# Patient Record
Sex: Male | Born: 1965 | Race: White | Hispanic: No | Marital: Married | Smoking: Current every day smoker
Health system: Southern US, Community
[De-identification: ages and names within clinical notes are randomized; demographics above are authoritative.]

## PROBLEM LIST (undated history)

## (undated) DIAGNOSIS — E785 Hyperlipidemia, unspecified: Secondary | ICD-10-CM

## (undated) DIAGNOSIS — N529 Male erectile dysfunction, unspecified: Secondary | ICD-10-CM

## (undated) DIAGNOSIS — K2270 Barrett's esophagus without dysplasia: Secondary | ICD-10-CM

## (undated) DIAGNOSIS — M5430 Sciatica, unspecified side: Secondary | ICD-10-CM

## (undated) DIAGNOSIS — J449 Chronic obstructive pulmonary disease, unspecified: Secondary | ICD-10-CM

## (undated) DIAGNOSIS — D649 Anemia, unspecified: Secondary | ICD-10-CM

## (undated) DIAGNOSIS — K852 Alcohol induced acute pancreatitis without necrosis or infection: Secondary | ICD-10-CM

## (undated) DIAGNOSIS — Z9289 Personal history of other medical treatment: Secondary | ICD-10-CM

## (undated) DIAGNOSIS — I639 Cerebral infarction, unspecified: Secondary | ICD-10-CM

## (undated) DIAGNOSIS — K759 Inflammatory liver disease, unspecified: Secondary | ICD-10-CM

## (undated) DIAGNOSIS — I10 Essential (primary) hypertension: Secondary | ICD-10-CM

## (undated) DIAGNOSIS — K219 Gastro-esophageal reflux disease without esophagitis: Secondary | ICD-10-CM

## (undated) DIAGNOSIS — M25561 Pain in right knee: Secondary | ICD-10-CM

## (undated) HISTORY — DX: Barrett's esophagus without dysplasia: K22.70

## (undated) HISTORY — DX: Gastro-esophageal reflux disease without esophagitis: K21.9

## (undated) HISTORY — PX: HEMORRHOID BANDING: SHX5850

## (undated) HISTORY — PX: UPPER GI ENDOSCOPY: SHX6162

## (undated) HISTORY — PX: CYST EXCISION: SHX5701

## (undated) HISTORY — DX: Male erectile dysfunction, unspecified: N52.9

## (undated) HISTORY — DX: Cerebral infarction, unspecified: I63.9

## (undated) HISTORY — DX: Pain in right knee: M25.561

## (undated) HISTORY — DX: Sciatica, unspecified side: M54.30

## (undated) HISTORY — PX: COLONOSCOPY: SHX174

---

## 2004-10-26 ENCOUNTER — Inpatient Hospital Stay: Payer: Self-pay | Admitting: Internal Medicine

## 2004-12-17 ENCOUNTER — Ambulatory Visit: Payer: Self-pay | Admitting: Gastroenterology

## 2005-03-22 ENCOUNTER — Ambulatory Visit: Payer: Self-pay | Admitting: Ophthalmology

## 2008-06-30 ENCOUNTER — Emergency Department: Payer: Self-pay | Admitting: Emergency Medicine

## 2012-03-09 ENCOUNTER — Ambulatory Visit: Payer: Self-pay | Admitting: Gastroenterology

## 2012-06-22 ENCOUNTER — Ambulatory Visit: Payer: Self-pay | Admitting: Family Medicine

## 2014-02-12 ENCOUNTER — Ambulatory Visit: Payer: Self-pay | Admitting: Specialist

## 2015-02-06 ENCOUNTER — Ambulatory Visit (INDEPENDENT_AMBULATORY_CARE_PROVIDER_SITE_OTHER): Payer: Medicare Other | Admitting: Urology

## 2015-02-06 ENCOUNTER — Encounter: Payer: Self-pay | Admitting: Urology

## 2015-02-06 VITALS — BP 141/109 | HR 91 | Temp 97.5°F | Resp 16 | Ht 70.0 in | Wt 160.5 lb

## 2015-02-06 DIAGNOSIS — Z309 Encounter for contraceptive management, unspecified: Secondary | ICD-10-CM | POA: Diagnosis not present

## 2015-02-06 DIAGNOSIS — Z3009 Encounter for other general counseling and advice on contraception: Secondary | ICD-10-CM | POA: Insufficient documentation

## 2015-02-06 MED ORDER — DIAZEPAM 10 MG PO TABS: 10.0000 mg | ORAL_TABLET | Freq: Once | ORAL | Status: DC

## 2015-02-06 NOTE — Progress Notes (Addendum)
02/06/2015 9:32 AM   Ronald Watts 07/18/1965 [id number]  Referring provider: No referring provider defined for this encounter.  Chief Complaint  Patient presents with  . VAS Consult    vasectomy consult    HPI: Ronald Watts is a 49 y.o. white male who present today requesting a vasectomy.    He has two children, a son and daughter, and wishes to complete his family unit at this time.  He denies any history of chronic prostatitis, epididymitis, orchitis, or other genital pain:   Today, we discussed what the vas deferens is, where it is located, and its function. We reviewed the procedure for vasectomy, it's risks, benefits, alternatives, and likelihood of achieving his goals.   We discussed in detail the procedure, complications, and recovery as well as the need for clearance prior to unprotected intercourse. We discussed that vasectomy does not protect against sexually transmitted diseases. We discussed that this procedure does not result in immediate sterility and that they would need to use other forms of birth control until he has been cleared with a three month negative postvasectomy semen analyses.  I explained that the procedure is considered to be permanent and that attempts at reversal have varying degrees of success. These options include vasectomy reversal, sperm retrieval, and in vitro fertilization; these can be very expensive. We discussed the chance of postvasectomy pain syndrome which occurs in less than 5% of patients.  I explained to the patient that there is no treatment to resolve this chronic pain, and that if it developed I would not be able to help resolve the issue, but that surgery is generally not needed for correction. I explained there have even been reports of systemic like illness associated with this chronic pain, and that there was no good cure. I explained that vasectomy it is not a 100% reliable form of birth control, and the risk of pregnancy after  vasectomy is approximately 1 in 2000 men who had a negative postvasectomy semen analysis or rare non-motile sperm.  I explained that repeat vasectomy was necessary in less than 1% of vasectomy procedures when employing the type of technique that is performed in the office. I explained that he should refrain from ejaculation for approximately one week following vasectomy. I explained that there are other options for birth control which are permanent and non-permanent; we discussed these.   I explained the rates of surgical complications, such as symptomatic hematoma or infection, are low (1-2%) and vary with the surgeon's experience and criteria used to diagnose the complication.    PMH: Past Medical History  Diagnosis Date  . Knee pain, right   . Sciatica   . GERD (gastroesophageal reflux disease)   . ED (erectile dysfunction)   . Stroke     optic nerve  . Barrett's syndrome     Surgical History: No past surgical history on file.  Home Medications:    Medication List       This list is accurate as of: 02/06/15  9:32 AM.  Always use your most recent med list.               cyclobenzaprine 10 MG tablet  Commonly known as:  FLEXERIL  Take 1 tablet by mouth 3 (three) times daily.     gabapentin 600 MG tablet  Commonly known as:  NEURONTIN  Take 1 tablet by mouth 3 (three) times daily.     gabapentin 100 MG capsule  Commonly known as:  NEURONTIN  Take 1 capsule by mouth 3 (three) times daily.     ibuprofen 800 MG tablet  Commonly known as:  ADVIL,MOTRIN  Take 1 tablet by mouth as needed.     omeprazole 20 MG capsule  Commonly known as:  PRILOSEC  Take 1 capsule by mouth daily.     tadalafil 10 MG tablet  Commonly known as:  CIALIS  Take 10 mg by mouth daily as needed for erectile dysfunction.        Allergies: No Known Allergies  Family History: Family History  Problem Relation Age of Onset  . Cancer Father   . Hypertension Father   . Hypertension Mother     . Diabetes Mother     Social History:  reports that he has been smoking Cigarettes.  He has a 30 pack-year smoking history. He has never used smokeless tobacco. He reports that he does not drink alcohol or use illicit drugs.  ROS: UROLOGY Frequent Urination?: No Hard to postpone urination?: No Burning/pain with urination?: No Get up at night to urinate?: No Leakage of urine?: No Urine stream starts and stops?: No Trouble starting stream?: No Do you have to strain to urinate?: No Blood in urine?: No Urinary tract infection?: No Sexually transmitted disease?: No Injury to kidneys or bladder?: No Painful intercourse?: No Weak stream?: No Erection problems?: Yes Penile pain?: No  Gastrointestinal Nausea?: No Vomiting?: No Indigestion/heartburn?: No Diarrhea?: No Constipation?: No  Constitutional Fever: No Night sweats?: No Weight loss?: No Fatigue?: No  Skin Skin rash/lesions?: No Itching?: No  Eyes Blurred vision?: No Double vision?: No  Ears/Nose/Throat Sore throat?: No Sinus problems?: No  Hematologic/Lymphatic Swollen glands?: No Easy bruising?: No  Cardiovascular Leg swelling?: No Chest pain?: No  Respiratory Cough?: No Shortness of breath?: No  Endocrine Excessive thirst?: No  Musculoskeletal Back pain?: No Joint pain?: No  Neurological Headaches?: No Dizziness?: No  Psychologic Depression?: No Anxiety?: No  Physical Exam: BP 141/109 mmHg  Pulse 91  Temp(Src) 97.5 F (36.4 C) (Oral)  Resp 16  Ht  (1.778 m)  Wt 160 lb 8 oz (72.802 kg)  BMI 23.03 kg/m2  GU: Patient with circumcised phallus.  Urethral meatus is patent.  No penile discharge. No penile lesions or rashes. Scrotum without lesions, cysts, rashes and/or edema.  Testicles are located scrotally bilaterally. No masses are appreciated in the testicles. Left and right epididymis are normal.   Laboratory Data: No results found for: WBC, HGB, HCT, MCV, PLT  No  results found for: CREATININE  No results found for: PSA  No results found for: TESTOSTERONE  No results found for: HGBA1C  Urinalysis No results found for: COLORURINE, APPEARANCEUR, LABSPEC, PHURINE, GLUCOSEU, HGBUR, BILIRUBINUR, KETONESUR, PROTEINUR, UROBILINOGEN, NITRITE, LEUKOCYTESUR  Pertinent Imaging:   Assessment & Plan:      1. Vasectomy consult:   He is given the pre-op vasectomy instruction sheet.  He is prescribed Valium 10 mg and instructed to take it 30 minutes prior to his vasectomy appointment.  He is to have a driver.   I reemphasized to the patient that this is to be considered a permanent form of birth control, that he is to use an alternative form of birth control until we receive the 3 months specimen and it is cleared of sperm and that this will not prevent STI's.   His questions are answered to his satisfaction and he understands the risks and is willing to proceed with the vasectomy.  He will schedule his vasectomy.  Patient also takes BC's powders.  I advised him to stop this medication ten days prior to the vasectomy to prevent severe bruising.    There are no diagnoses linked to this encounter.  No Follow-up on file.  Michiel Cowboy, PA-C  [facility] [address], [address] [place], [place] [address] [phone]

## 2015-02-08 ENCOUNTER — Telehealth: Payer: Self-pay | Admitting: Urology

## 2015-02-08 NOTE — Telephone Encounter (Signed)
Did patient sign a consent for the vasectomy at his visit on Friday?  If he didn't, he needs to sign one before his vasectomy appointment.  I have prescribed Valium and he will take that the day of procedure and he will not be able to sign a consent under the influence.

## 2015-02-09 NOTE — Telephone Encounter (Addendum)
I spoke w/the pt; he new only that he signed 2 forms but did not remember what they were. Pt will come to [facility] to complete a consent form that will be left at reception. . . sm

## 2017-11-19 ENCOUNTER — Encounter: Payer: Self-pay | Admitting: Emergency Medicine

## 2017-11-19 ENCOUNTER — Emergency Department
Admission: EM | Admit: 2017-11-19 | Discharge: 2017-11-19 | Disposition: A | Discharge status: Home / Self Care | Payer: Medicare Other | Attending: Emergency Medicine | Admitting: Emergency Medicine

## 2017-11-19 DIAGNOSIS — F17210 Nicotine dependence, cigarettes, uncomplicated: Secondary | ICD-10-CM | POA: Diagnosis not present

## 2017-11-19 DIAGNOSIS — L03115 Cellulitis of right lower limb: Secondary | ICD-10-CM | POA: Insufficient documentation

## 2017-11-19 DIAGNOSIS — L03119 Cellulitis of unspecified part of limb: Secondary | ICD-10-CM

## 2017-11-19 DIAGNOSIS — Z79899 Other long term (current) drug therapy: Secondary | ICD-10-CM | POA: Insufficient documentation

## 2017-11-19 DIAGNOSIS — Z8673 Personal history of transient ischemic attack (TIA), and cerebral infarction without residual deficits: Secondary | ICD-10-CM | POA: Diagnosis not present

## 2017-11-19 DIAGNOSIS — M79671 Pain in right foot: Secondary | ICD-10-CM | POA: Diagnosis present

## 2017-11-19 MED ORDER — TRAMADOL HCL 50 MG PO TABS: 50.0000 mg | ORAL_TABLET | Freq: Four times a day (QID) | ORAL | 0 refills | Status: DC | PRN

## 2017-11-19 MED ORDER — SULFAMETHOXAZOLE-TRIMETHOPRIM 800-160 MG PO TABS
1.0000 | ORAL_TABLET | Freq: Once | ORAL | Status: AC
  Administered 2017-11-19: 1 via ORAL
  Filled 2017-11-19: qty 1

## 2017-11-19 MED ORDER — SULFAMETHOXAZOLE-TRIMETHOPRIM 800-160 MG PO TABS: 1.0000 | ORAL_TABLET | Freq: Two times a day (BID) | ORAL | 0 refills | Status: DC

## 2017-11-19 NOTE — ED Triage Notes (Signed)
Pt c/o insect bite to the top of right foot. Area is raised with white head and red around. No discharge noted.

## 2017-11-19 NOTE — ED Provider Notes (Signed)
[facility] Emergency Department Provider Note  ____________________________________________  Time seen: Approximately 10:48 PM  I have reviewed the triage vital signs and the nursing notes.   HISTORY  Chief Complaint No chief complaint on file.    HPI Ronald Watts is a 52 y.o. male who presents the emergency department complaining of right foot pain, swelling.  Patient reports that he believes that something "bit him".  Patient reports redness, erythema with a "bump" just proximal to the fourth digit of the right foot.  Patient reports that he has pain to the area but no numbness or tingling.  No definitive trauma to the area.  No history of recurrent skin lesions.  Patient denies any systemic complaints of fevers or chills, nausea vomiting, abdominal pain.  No medications for this complaint prior to arrival.  No other complaints at this time.  Patient has a history of Barrett's syndrome, erectile dysfunction, GERD, optic nerve stroke.  No complaints with chronic medical problems.  Past Medical History:  Diagnosis Date  . Barrett's syndrome   . ED (erectile dysfunction)   . GERD (gastroesophageal reflux disease)   . Knee pain, right   . Sciatica   . Stroke [facility])    optic nerve    Patient Active Problem List   Diagnosis Date Noted  . Vasectomy evaluation 02/06/2015    History reviewed. No pertinent surgical history.  Prior to Admission medications   Medication Sig Start Date End Date Taking? Authorizing Provider  cyclobenzaprine (FLEXERIL) 10 MG tablet Take 1 tablet by mouth 3 (three) times daily. 01/13/15   [provider]  diazepam (VALIUM) 10 MG tablet Take 1 tablet (10 mg total) by mouth once. Take thirty minutes prior to the procedure 02/06/15   Michiel Cowboy A, PA-C  gabapentin (NEURONTIN) 100 MG capsule Take 1 capsule by mouth 3 (three) times daily. 01/13/15   [provider]  gabapentin (NEURONTIN) 600 MG tablet Take 1 tablet by  mouth 3 (three) times daily. 01/13/15   [provider]  ibuprofen (ADVIL,MOTRIN) 800 MG tablet Take 1 tablet by mouth as needed. 01/13/15   [provider]  omeprazole (PRILOSEC) 20 MG capsule Take 1 capsule by mouth daily. 01/13/15   [provider]  sulfamethoxazole-trimethoprim (BACTRIM DS,SEPTRA DS) 800-160 MG tablet Take 1 tablet by mouth 2 (two) times daily. 11/19/17   Cuthriell, Delorise Royals, PA-C  tadalafil (CIALIS) 10 MG tablet Take 10 mg by mouth daily as needed for erectile dysfunction.    [provider]  traMADol (ULTRAM) 50 MG tablet Take 1 tablet (50 mg total) by mouth every 6 (six) hours as needed. 11/19/17   Cuthriell, Delorise Royals, PA-C    Allergies Patient has no known allergies.  Family History  Problem Relation Age of Onset  . Cancer Father   . Hypertension Father   . Hypertension Mother   . Diabetes Mother     Social History Social History   Tobacco Use  . Smoking status: Current Every Day Smoker    Packs/day: 1.00    Years: 30.00    Pack years: 30.00    Types: Cigarettes  . Smokeless tobacco: Never Used  Substance Use Topics  . Alcohol use: No    Alcohol/week: 0.0 oz  . Drug use: No     Review of Systems  Constitutional: No fever/chills Eyes: No visual changes. Cardiovascular: no chest pain. Respiratory: no cough. No SOB. Gastrointestinal: No abdominal pain.  No nausea, no vomiting. Musculoskeletal: Negative for  musculoskeletal pain. Skin: Positive for "bug bite" to the right foot Neurological: Negative for headaches, focal weakness or numbness. 10-point ROS otherwise negative.  ____________________________________________   PHYSICAL EXAM:  VITAL SIGNS: ED Triage Vitals  Enc Vitals Group     BP 11/19/17 2129 (!) 150/112     Pulse Rate 11/19/17 2129 92     Resp --      Temp 11/19/17 2129 98.6 F (37 C)     Temp Source 11/19/17 2129 Oral     SpO2 11/19/17 2129 97 %     Weight 11/19/17 2137 160 lb (72.6 kg)      Height --      Head Circumference --      Peak Flow --      Pain Score --      Pain Loc --      Pain Edu? --      Excl. in GC? --      Constitutional: Alert and oriented. Well appearing and in no acute distress. Eyes: Conjunctivae are normal. PERRL. EOMI. Head: Atraumatic. Neck: No stridor.    Cardiovascular: Normal rate, regular rhythm. Normal S1 and S2.  Good peripheral circulation. Respiratory: Normal respiratory effort without tachypnea or retractions. Lungs CTAB. Good air entry to the bases with no decreased or absent breath sounds. Musculoskeletal: Full range of motion to all extremities. No gross deformities appreciated. Neurologic:  Normal speech and language. No gross focal neurologic deficits are appreciated.  Skin:  Skin is warm, dry and intact. No rash noted.  Mild erythema noted to the dorsal aspect of the right foot just proximal to the fourth and fifth digits.  Central lesion with increased erythema and edema measuring approximately 1 cm diameter.  Total erythema measures approximately 5 cm in diameter.  No central excoriation.  No necrosis.  No fluctuance or induration.  Sensation intact all 5 digits.  Capillary refill less than 2 seconds all digits.  No streaking. Psychiatric: Mood and affect are normal. Speech and behavior are normal. Patient exhibits appropriate insight and judgement.   ____________________________________________   LABS (all labs ordered are listed, but only abnormal results are displayed)  Labs Reviewed - No data to display ____________________________________________  EKG   ____________________________________________  RADIOLOGY   No results found.  ____________________________________________    PROCEDURES  Procedure(s) performed:    Procedures    Medications  sulfamethoxazole-trimethoprim (BACTRIM DS,SEPTRA DS) 800-160 MG per tablet 1 tablet (has no administration in time range)      ____________________________________________   INITIAL IMPRESSION / ASSESSMENT AND PLAN / ED COURSE  Pertinent labs & imaging results that were available during my care of the patient were reviewed by me and considered in my medical decision making (see chart for details).  Review of the [place] CSRS was performed in accordance of the [facility] prior to dispensing any controlled drugs.     Patient's diagnosis is consistent with foot cellulitis.  Patient presents the emergency department complaining of a "bug bite" to the right foot.  Visualization is consistent with mild cellulitis with no indication of abscess.  Patient was given first dose of antibiotics in the emergency department.. Patient will be discharged home with prescriptions for Bactrim. Patient is to follow up with primary care as needed or otherwise directed. Patient is given ED precautions to return to the ED for any worsening or new symptoms.     ____________________________________________  FINAL CLINICAL IMPRESSION(S) / ED DIAGNOSES  Final diagnoses:  Cellulitis of foot  NEW MEDICATIONS STARTED DURING THIS VISIT:  ED Discharge Orders        Ordered    traMADol (ULTRAM) 50 MG tablet  Every 6 hours PRN     11/19/17 2314    sulfamethoxazole-trimethoprim (BACTRIM DS,SEPTRA DS) 800-160 MG tablet  2 times daily     11/19/17 2316          This chart was dictated using voice recognition software/Dragon. Despite best efforts to proofread, errors can occur which can change the meaning. Any change was purely unintentional.    Racheal Patches, PA-C 11/19/17 2317    Myrna Blazer, MD 11/19/17 [phone]

## 2018-01-12 ENCOUNTER — Inpatient Hospital Stay: Admission: RE | Admit: 2018-01-12 | Payer: Medicare Other | Source: Ambulatory Visit

## 2018-01-22 ENCOUNTER — Inpatient Hospital Stay: Admission: RE | Admit: 2018-01-22 | Payer: Medicare Other | Source: Ambulatory Visit

## 2018-01-25 ENCOUNTER — Other Ambulatory Visit: Payer: Self-pay

## 2018-01-25 ENCOUNTER — Encounter
Admission: RE | Admit: 2018-01-25 | Discharge: 2018-01-25 | Disposition: A | Discharge status: Home / Self Care | Payer: Medicare Other | Source: Ambulatory Visit | Attending: Orthopedic Surgery | Admitting: Orthopedic Surgery

## 2018-01-25 DIAGNOSIS — Z01812 Encounter for preprocedural laboratory examination: Secondary | ICD-10-CM | POA: Diagnosis not present

## 2018-01-25 DIAGNOSIS — Z01810 Encounter for preprocedural cardiovascular examination: Secondary | ICD-10-CM | POA: Diagnosis present

## 2018-01-25 DIAGNOSIS — E785 Hyperlipidemia, unspecified: Secondary | ICD-10-CM | POA: Diagnosis not present

## 2018-01-25 DIAGNOSIS — I10 Essential (primary) hypertension: Secondary | ICD-10-CM | POA: Insufficient documentation

## 2018-01-25 HISTORY — DX: Hyperlipidemia, unspecified: E78.5

## 2018-01-25 HISTORY — DX: Chronic obstructive pulmonary disease, unspecified: J44.9

## 2018-01-25 HISTORY — DX: Anemia, unspecified: D64.9

## 2018-01-25 HISTORY — DX: Personal history of other medical treatment: Z92.89

## 2018-01-25 HISTORY — DX: Essential (primary) hypertension: I10

## 2018-01-25 LAB — CBC
HEMATOCRIT: 30.1 % — AB (ref 40.0–52.0)
Hemoglobin: 9.7 g/dL — ABNORMAL LOW (ref 13.0–18.0)
MCH: 24.3 pg — ABNORMAL LOW (ref 26.0–34.0)
MCHC: 32.4 g/dL (ref 32.0–36.0)
MCV: 75 fL — ABNORMAL LOW (ref 80.0–100.0)
Platelets: 325 10*3/uL (ref 150–440)
RBC: 4.01 MIL/uL — ABNORMAL LOW (ref 4.40–5.90)
RDW: 19 % — AB (ref 11.5–14.5)
WBC: 7.9 10*3/uL (ref 3.8–10.6)

## 2018-01-25 NOTE — Pre-Procedure Instructions (Signed)
Patient with elevated blood pressure, stated he did take his Lisinipril  this am. Patient encouraged to seek treatment at /emergency "Dept but prefers to contact Primary Physician states if unable see PMD today will go to ED for treatment. Patient and wife verbalized understanding of need for treatment of elevated blood pressure today.

## 2018-01-25 NOTE — Patient Instructions (Signed)
Your procedure is scheduled on: Monday 01/29/18 Report to DAY SURGERY DEPARTMENT LOCATED ON 2ND FLOOR MEDICAL MALL ENTRANCE. To find out your arrival time please call [phone] between 1PM - 3PM on Friday 01/26/18.  Remember: Instructions that are not followed completely may result in serious medical risk, up to and including death, or upon the discretion of your surgeon and anesthesiologist your surgery may need to be rescheduled.     _X__ 1. Do not eat food after midnight the night before your procedure.                 No gum chewing or hard candies. You may drink clear liquids up to 2 hours                 before you are scheduled to arrive for your surgery- DO not drink clear                 liquids within 2 hours of the start of your surgery.                 Clear Liquids include:  water, apple juice without pulp, clear carbohydrate                 drink such as Clearfast or Gatorade, Black Coffee or Tea (Do not add                 anything to coffee or tea).  __X__2.  On the morning of surgery brush your teeth with toothpaste and water, you                 may rinse your mouth with mouthwash if you wish.  Do not swallow any              toothpaste of mouthwash.     _X__ 3.  No Alcohol for 24 hours before or after surgery.   _X__ 4.  Do Not Smoke or use e-cigarettes For 24 Hours Prior to Your Surgery.                 Do not use any chewable tobacco products for at least 6 hours prior to                 surgery.  ____  5.  Bring all medications with you on the day of surgery if instructed.   __X__  6.  Notify your doctor if there is any change in your medical condition      (cold, fever, infections).     Do not wear jewelry, make-up, hairpins, clips or nail polish. Do not wear lotions, powders, or perfumes.  Do not shave 48 hours prior to surgery. Men may shave face and neck. Do not bring valuables to the hospital.    [facility] any belongings or  valuables.  Contacts, dentures/partials or body piercings may not be worn into surgery. Bring a case for your contacts, glasses or hearing aids, a denture cup will be supplied. Leave your suitcase in the car. After surgery it may be brought to your room. For patients admitted to the hospital, discharge time is determined by your treatment team.   Patients discharged the day of surgery will not be allowed to drive home.   Please read over the following fact sheets that you were given:   MRSA Information  __X__ Take these medicines the morning of surgery with A SIP OF WATER:  1. GABAPENTIN  2. OMEPRAZOLE AT BEDTIME AND AGAIN MORNING OR PROCEDURE  3. ATORVASTATIN  4.  5.  6.  ____ Fleet Enema (as directed)   __X__ Use CHG Soap/SAGE wipes as directed  ____ Use inhalers on the day of surgery  ____ Stop metformin/Janumet/Farxiga 2 days prior to surgery    ____ Take 1/2 of usual insulin dose the night before surgery. No insulin the morning          of surgery.   ____ Stop Blood Thinners Coumadin/Plavix/Xarelto/Pleta/Pradaxa/Eliquis/Effient/Aspirin  on   Or contact your Surgeon, Cardiologist or Medical Doctor regarding  ability to stop your blood thinners  __X__ Stop Anti-inflammatories 7 days before surgery such as Advil,IBUPROFEN (TODAY), Motrin,  BC or Goodies Powder, Naprosyn, Naproxen, Aleve, Aspirin YOU MAY USE TYLENOL IF NEEDED   __X__ Stop all herbal supplements, fish oil or vitamin E until after surgery.    ____ Bring C-Pap to the hospital.

## 2018-01-25 NOTE — Pre-Procedure Instructions (Signed)
CLEARED BY DR L MILES. LISINOPRIL INCREASED TO 20-12.5MG 

## 2018-01-25 NOTE — Pre-Procedure Instructions (Addendum)
PATIENT INSTRUCTED TO SEE PCP AT 1030 TODAY. FAXING EKG/ REQUEST TO [facility]

## 2018-01-28 ENCOUNTER — Encounter: Payer: Self-pay | Admitting: Anesthesiology

## 2018-01-29 ENCOUNTER — Ambulatory Visit: Payer: Medicare Other | Admitting: Anesthesiology

## 2018-01-29 ENCOUNTER — Ambulatory Visit
Admission: RE | Admit: 2018-01-29 | Discharge: 2018-01-29 | Disposition: A | Discharge status: Home / Self Care | Payer: Medicare Other | Source: Ambulatory Visit | Attending: Orthopedic Surgery | Admitting: Orthopedic Surgery

## 2018-01-29 ENCOUNTER — Other Ambulatory Visit: Payer: Self-pay

## 2018-01-29 ENCOUNTER — Encounter: Payer: Self-pay | Admitting: *Deleted

## 2018-01-29 ENCOUNTER — Encounter
Admission: RE | Disposition: A | Discharge status: Home / Self Care | Payer: Self-pay | Source: Ambulatory Visit | Attending: Orthopedic Surgery

## 2018-01-29 DIAGNOSIS — Z79899 Other long term (current) drug therapy: Secondary | ICD-10-CM | POA: Diagnosis not present

## 2018-01-29 DIAGNOSIS — F17200 Nicotine dependence, unspecified, uncomplicated: Secondary | ICD-10-CM | POA: Diagnosis not present

## 2018-01-29 DIAGNOSIS — I6930 Unspecified sequelae of cerebral infarction: Secondary | ICD-10-CM | POA: Insufficient documentation

## 2018-01-29 DIAGNOSIS — K219 Gastro-esophageal reflux disease without esophagitis: Secondary | ICD-10-CM | POA: Diagnosis not present

## 2018-01-29 DIAGNOSIS — I10 Essential (primary) hypertension: Secondary | ICD-10-CM | POA: Diagnosis not present

## 2018-01-29 DIAGNOSIS — M7021 Olecranon bursitis, right elbow: Secondary | ICD-10-CM | POA: Diagnosis not present

## 2018-01-29 HISTORY — PX: OLECRANON BURSECTOMY: SHX2097

## 2018-01-29 LAB — URINE DRUG SCREEN, QUALITATIVE (ARMC ONLY)
AMPHETAMINES, UR SCREEN: NOT DETECTED
Benzodiazepine, Ur Scrn: NOT DETECTED
Cannabinoid 50 Ng, Ur ~~LOC~~: POSITIVE — AB
Cocaine Metabolite,Ur ~~LOC~~: NOT DETECTED
MDMA (ECSTASY) UR SCREEN: NOT DETECTED
Methadone Scn, Ur: NOT DETECTED
Opiate, Ur Screen: NOT DETECTED
PHENCYCLIDINE (PCP) UR S: NOT DETECTED
Tricyclic, Ur Screen: NOT DETECTED

## 2018-01-29 SURGERY — BURSECTOMY, ELBOW
Anesthesia: General | Site: Elbow | Laterality: Right | Wound class: Clean

## 2018-01-29 MED ORDER — ACETAMINOPHEN 10 MG/ML IV SOLN
INTRAVENOUS | Status: DC | PRN
  Administered 2018-01-29: 1000 mg via INTRAVENOUS

## 2018-01-29 MED ORDER — SUGAMMADEX SODIUM 200 MG/2ML IV SOLN
INTRAVENOUS | Status: DC | PRN
  Administered 2018-01-29: 200 mg via INTRAVENOUS

## 2018-01-29 MED ORDER — FENTANYL CITRATE (PF) 100 MCG/2ML IJ SOLN
INTRAMUSCULAR | Status: AC
  Filled 2018-01-29: qty 2

## 2018-01-29 MED ORDER — NEOMYCIN-POLYMYXIN B GU 40-200000 IR SOLN
Status: AC
  Filled 2018-01-29: qty 2

## 2018-01-29 MED ORDER — ROCURONIUM 10MG/ML (10ML) SYRINGE FOR MEDFUSION PUMP - OPTIME
INTRAVENOUS | Status: DC | PRN
  Administered 2018-01-29: 30 mg via INTRAVENOUS

## 2018-01-29 MED ORDER — ASPIRIN EC 325 MG PO TBEC: 325.0000 mg | DELAYED_RELEASE_TABLET | Freq: Every day | ORAL | 0 refills | Status: AC

## 2018-01-29 MED ORDER — PROPOFOL 10 MG/ML IV BOLUS
INTRAVENOUS | Status: AC
  Filled 2018-01-29: qty 20

## 2018-01-29 MED ORDER — HYDROMORPHONE HCL 1 MG/ML IJ SOLN
INTRAMUSCULAR | Status: DC | PRN
  Administered 2018-01-29: .8 mg via INTRAVENOUS

## 2018-01-29 MED ORDER — BUPIVACAINE HCL (PF) 0.5 % IJ SOLN
INTRAMUSCULAR | Status: AC
  Filled 2018-01-29: qty 30

## 2018-01-29 MED ORDER — MIDAZOLAM HCL 2 MG/2ML IJ SOLN
INTRAMUSCULAR | Status: AC
  Filled 2018-01-29: qty 2

## 2018-01-29 MED ORDER — LIDOCAINE HCL (PF) 2 % IJ SOLN
INTRAMUSCULAR | Status: AC
  Filled 2018-01-29: qty 10

## 2018-01-29 MED ORDER — LIDOCAINE-EPINEPHRINE 1 %-1:100000 IJ SOLN
INTRAMUSCULAR | Status: AC
  Filled 2018-01-29: qty 1

## 2018-01-29 MED ORDER — IBUPROFEN 800 MG PO TABS: 800.0000 mg | ORAL_TABLET | Freq: Three times a day (TID) | ORAL | 0 refills | Status: AC

## 2018-01-29 MED ORDER — PROPOFOL 10 MG/ML IV BOLUS
INTRAVENOUS | Status: DC | PRN
  Administered 2018-01-29: 150 mg via INTRAVENOUS
  Administered 2018-01-29: 50 mg via INTRAVENOUS

## 2018-01-29 MED ORDER — CEFAZOLIN SODIUM-DEXTROSE 2-4 GM/100ML-% IV SOLN: 2.0000 g | Freq: Once | INTRAVENOUS | Status: DC

## 2018-01-29 MED ORDER — ACETAMINOPHEN 160 MG/5ML PO SOLN
325.0000 mg | ORAL | Status: DC | PRN
  Filled 2018-01-29: qty 20.3

## 2018-01-29 MED ORDER — HYDROMORPHONE HCL 1 MG/ML IJ SOLN: 0.2500 mg | INTRAMUSCULAR | Status: DC | PRN

## 2018-01-29 MED ORDER — FENTANYL CITRATE (PF) 100 MCG/2ML IJ SOLN
INTRAMUSCULAR | Status: DC | PRN
  Administered 2018-01-29 (×2): 50 ug via INTRAVENOUS

## 2018-01-29 MED ORDER — DEXAMETHASONE SODIUM PHOSPHATE 10 MG/ML IJ SOLN
INTRAMUSCULAR | Status: DC | PRN
  Administered 2018-01-29: 10 mg via INTRAVENOUS

## 2018-01-29 MED ORDER — BUPIVACAINE HCL (PF) 0.5 % IJ SOLN
INTRAMUSCULAR | Status: DC | PRN
  Administered 2018-01-29: 5 mL

## 2018-01-29 MED ORDER — ONDANSETRON 4 MG PO TBDP: 4.0000 mg | ORAL_TABLET | Freq: Three times a day (TID) | ORAL | 0 refills | Status: DC | PRN

## 2018-01-29 MED ORDER — HYDROMORPHONE HCL 1 MG/ML IJ SOLN
INTRAMUSCULAR | Status: AC
  Filled 2018-01-29: qty 1

## 2018-01-29 MED ORDER — ACETAMINOPHEN 10 MG/ML IV SOLN
INTRAVENOUS | Status: AC
  Filled 2018-01-29: qty 100

## 2018-01-29 MED ORDER — CEFAZOLIN SODIUM-DEXTROSE 2-4 GM/100ML-% IV SOLN
INTRAVENOUS | Status: AC
  Filled 2018-01-29: qty 100

## 2018-01-29 MED ORDER — PROMETHAZINE HCL 25 MG/ML IJ SOLN: 6.2500 mg | INTRAMUSCULAR | Status: DC | PRN

## 2018-01-29 MED ORDER — ONDANSETRON HCL 4 MG/2ML IJ SOLN
INTRAMUSCULAR | Status: DC | PRN
  Administered 2018-01-29: 4 mg via INTRAVENOUS

## 2018-01-29 MED ORDER — ACETAMINOPHEN 500 MG PO TABS: 1000.0000 mg | ORAL_TABLET | Freq: Three times a day (TID) | ORAL | 2 refills | Status: AC

## 2018-01-29 MED ORDER — OXYCODONE HCL 5 MG PO TABS: 5.0000 mg | ORAL_TABLET | ORAL | 0 refills | Status: AC | PRN

## 2018-01-29 MED ORDER — MEPERIDINE HCL 50 MG/ML IJ SOLN: 6.2500 mg | INTRAMUSCULAR | Status: DC | PRN

## 2018-01-29 MED ORDER — PHENYLEPHRINE HCL 10 MG/ML IJ SOLN
INTRAMUSCULAR | Status: AC
  Filled 2018-01-29: qty 1

## 2018-01-29 MED ORDER — MIDAZOLAM HCL 2 MG/2ML IJ SOLN
INTRAMUSCULAR | Status: DC | PRN
  Administered 2018-01-29: 2 mg via INTRAVENOUS

## 2018-01-29 MED ORDER — LIDOCAINE HCL (CARDIAC) PF 100 MG/5ML IV SOSY
PREFILLED_SYRINGE | INTRAVENOUS | Status: DC | PRN
  Administered 2018-01-29: 60 mg via INTRAVENOUS

## 2018-01-29 MED ORDER — LACTATED RINGERS IV SOLN
Freq: Once | INTRAVENOUS | Status: AC
  Administered 2018-01-29: 07:00:00 via INTRAVENOUS

## 2018-01-29 MED ORDER — ACETAMINOPHEN 325 MG PO TABS: 325.0000 mg | ORAL_TABLET | ORAL | Status: DC | PRN

## 2018-01-29 MED ORDER — ROCURONIUM BROMIDE 50 MG/5ML IV SOLN
INTRAVENOUS | Status: AC
  Filled 2018-01-29: qty 1

## 2018-01-29 MED ORDER — HYDROCODONE-ACETAMINOPHEN 7.5-325 MG PO TABS: 1.0000 | ORAL_TABLET | Freq: Once | ORAL | Status: DC | PRN

## 2018-01-29 MED ORDER — LACTATED RINGERS IV SOLN
INTRAVENOUS | Status: DC | PRN
  Administered 2018-01-29: 07:00:00 via INTRAVENOUS

## 2018-01-29 SURGICAL SUPPLY — 36 items
BANDAGE ELASTIC 4 LF NS (GAUZE/BANDAGES/DRESSINGS) ×2 IMPLANT
CANISTER SUCT 1200ML W/VALVE (MISCELLANEOUS) ×2 IMPLANT
CHLORAPREP W/TINT 26ML (MISCELLANEOUS) ×2 IMPLANT
CUFF DUAL TOURNIQUET 18IN DISP (TOURNIQUET CUFF) ×2 IMPLANT
CUFF TOURN 24 STER (MISCELLANEOUS) IMPLANT
DRAPE SHEET LG 3/4 BI-LAMINATE (DRAPES) ×2 IMPLANT
ELECT CAUTERY BLADE 6.4 (BLADE) ×2 IMPLANT
ELECT REM PT RETURN 9FT ADLT (ELECTROSURGICAL) ×2
ELECTRODE REM PT RTRN 9FT ADLT (ELECTROSURGICAL) ×1 IMPLANT
GAUZE PETRO XEROFOAM 1X8 (MISCELLANEOUS) ×2 IMPLANT
GLOVE BIOGEL PI IND STRL 8 (GLOVE) ×1 IMPLANT
GLOVE BIOGEL PI INDICATOR 8 (GLOVE) ×1
GLOVE SURG SYN 7.5  E (GLOVE) ×1
GLOVE SURG SYN 7.5 E (GLOVE) ×1 IMPLANT
GOWN STRL REUS W/ TWL LRG LVL3 (GOWN DISPOSABLE) ×1 IMPLANT
GOWN STRL REUS W/ TWL XL LVL3 (GOWN DISPOSABLE) ×1 IMPLANT
GOWN STRL REUS W/TWL LRG LVL3 (GOWN DISPOSABLE) ×1
GOWN STRL REUS W/TWL XL LVL3 (GOWN DISPOSABLE) ×1
KIT TURNOVER KIT A (KITS) ×2 IMPLANT
NEEDLE FILTER BLUNT 18X 1/2SAF (NEEDLE) ×1
NEEDLE FILTER BLUNT 18X1 1/2 (NEEDLE) ×1 IMPLANT
NS IRRIG 500ML POUR BTL (IV SOLUTION) ×2 IMPLANT
PACK EXTREMITY ARMC (MISCELLANEOUS) ×2 IMPLANT
PAD ABD DERMACEA PRESS 5X9 (GAUZE/BANDAGES/DRESSINGS) ×2 IMPLANT
PAD CAST CTTN 4X4 STRL (SOFTGOODS) ×2 IMPLANT
PAD PREP 24X41 OB/GYN DISP (PERSONAL CARE ITEMS) ×2 IMPLANT
PADDING CAST COTTON 4X4 STRL (SOFTGOODS) ×2
SLING ARM LRG DEEP (SOFTGOODS) ×2 IMPLANT
SPLINT CAST 1 STEP 5X30 WHT (MISCELLANEOUS) IMPLANT
SPONGE LAP 18X18 RF (DISPOSABLE) IMPLANT
STAPLER SKIN PROX 35W (STAPLE) ×2 IMPLANT
SUT ETHILON 3-0 FS-10 30 BLK (SUTURE) ×2
SUT VIC AB 0 CT2 27 (SUTURE) ×2 IMPLANT
SUT VIC AB 2-0 CT2 27 (SUTURE) ×2 IMPLANT
SUTURE EHLN 3-0 FS-10 30 BLK (SUTURE) ×1 IMPLANT
SYR 5ML LL (SYRINGE) ×2 IMPLANT

## 2018-01-29 NOTE — Transfer of Care (Signed)
Immediate Anesthesia Transfer of Care Note  Patient: Ronald Watts  Procedure(s) Performed: Beverely RisenLECRANON BURSECTOMY AND  ELBOW DEBRIDMENT (Right Elbow)  Patient Location: PACU  Anesthesia Type:General  Level of Consciousness: sedated  Airway & Oxygen Therapy: Patient Spontanous Breathing and Patient connected to face mask  Post-op Assessment: Report given to RN and Post -op Vital signs reviewed and stable  Post vital signs: Reviewed  Last Vitals:  Vitals Value Taken Time  BP 124/87 01/29/2018  9:13 AM  Temp 36.2 C 01/29/2018  9:13 AM  Pulse 77 01/29/2018  9:23 AM  Resp 23 01/29/2018  9:23 AM  SpO2 94 % 01/29/2018  9:23 AM  Vitals shown include unvalidated device data.  Last Pain:  Vitals:   01/29/18 0913  TempSrc:   PainSc: 0-No pain         Complications: No apparent anesthesia complications

## 2018-01-29 NOTE — OR Nursing (Signed)
Discharge instructions discussed with pt and family. All voice understanding. 

## 2018-01-29 NOTE — Anesthesia Procedure Notes (Signed)
Procedure Name: Intubation Date/Time: 01/29/2018 7:36 AM Performed by: Justus Memory, CRNA Pre-anesthesia Checklist: Patient identified, Patient being monitored, Timeout performed, Emergency Drugs available and Suction available Patient Re-evaluated:Patient Re-evaluated prior to induction Oxygen Delivery Method: Circle system utilized Preoxygenation: Pre-oxygenation with 100% oxygen Induction Type: IV induction Ventilation: Mask ventilation without difficulty Laryngoscope Size: Mac and 3 Grade View: Grade I Tube type: Oral Tube size: 7.0 mm Number of attempts: 1 Airway Equipment and Method: Stylet Placement Confirmation: ETT inserted through vocal cords under direct vision,  positive ETCO2 and breath sounds checked- equal and bilateral Secured at: 21 cm Tube secured with: Tape Dental Injury: Teeth and Oropharynx as per pre-operative assessment

## 2018-01-29 NOTE — Op Note (Addendum)
DATE OF SURGERY: 01/29/2018   PRE-OP DIAGNOSIS:  1. Right olecranon bursitis   POST-OP DIAGNOSIS:  1. Right olecranon bursitis   PROCEDURES:  1. Right olecranon bursectomy 2. Right elbow excisional debridement including deep fascia and subcutaneous tissue   SURGEON: Rosealee AlbeeSunny H. Elsye Mccollister, MD   ASSISTANT(S): none   ANESTHESIA: gen   TOTAL IV FLUIDS: see anesthesia record   ESTIMATED BLOOD LOSS: 50cc   TOURNIQUET TIME: 15 min   DRAINS:  none   SPECIMENS:  Specimen x 1 of olecranon bursa tissue   COMPLICATIONS: None apparent.   INDICATIONS: Ronald Watts is a 52 y.o. male who developed focal swelling about his olecranon. He underwent non-surgical management with NSAIDs, compression, and an intrabursal steroid injection without adequate relief. He did not undergo aspiration as ultrasound did not showed thickened bursa, but no focal fluid collection. His symptoms continue to persist. After discussion of risks, benefits, and alternatives, the patient agreed to proceed with surgery.  We discussed at length that there was a risk of wound breakdown, recurrence, and residual pain after surgery.   DETAILS OF PROCEDURE: The patient was identified in the preoperative holding area.  The patient was brought to the operating room and placed on the table. Anesthesia was administered. The patient was then transferred to the lateral position on a beanbag. Operative arm was placed in an arm holder so the elbow rested at 90 degrees. Arm was prescrubbed with Hibiclens and alcohol, prepped with ChloraPrep and draped in the usual sterile fashion. A surgical time-out occurred. A sterile, well-padded sterile tourniquet was placed. Appropriate IV antibiotics were administered.   The arm was elevated and tourniquet inflated to 250mmHg. A midline incision was created about the elbow curving laterally to avoid the tip of the olecranon. We sharply dissected down to the level of the olecranon bursa. There was no  apparent sign of infection. There was a firm, nodular dense mass of thickened olecranon bursa. This was excised using a combination of a knife, bovie electrocautery device, and tenotomy scissors. This was then sent for pathology evaluation. A combination of a rongeur, curette and knife was used to debride the remnants of the olecranon bursa. A rongeur and curette were used again to debride all loose and devitalized tissue including the remainder of the olecranon bursa and subcutaneous tissue. The triceps fascia and tip of the olecranon process were then visualized. These were debrided with a rongeur. The underlying triceps muscle was healthy and viable. The bone of the olecranon itself was not infected and appeared healthy. The wound was thoroughly irrigated.   Tourniquet was let down and hemostasis was achieved. The subdermal layer was closed with 2-0 vicryl. The skin was closed with 3-0 nylon in a vertical mattress fashion. The wound was dressed with Xeroform, fluffs, and cotton wrap. A posterior splint was applied .    Instrument, sponge, and needle counts were correct prior to wound closure and at the conclusion of the case. The patient was then awakened from anesthesia without complication.     POST-OPERATIVE PLAN: Patient will be discharged to home. The patient will be NWB in splint for 2 weeks. He will follow up in ~10-14 days for transition out of splint and suture removal.

## 2018-01-29 NOTE — Anesthesia Preprocedure Evaluation (Addendum)
Anesthesia Evaluation  Patient identified by MRN, date of birth, ID band Patient awake    Reviewed: Allergy & Precautions, H&P , NPO status , reviewed documented beta blocker date and time   Airway Mallampati: II  TM Distance: >3 FB Neck ROM: full    Dental  (+) Poor Dentition, Chipped, Missing, Dental Advidsory Given Very poor dentition, no loose reported:   Pulmonary Current Smoker,     + decreased breath sounds      Cardiovascular hypertension, Normal cardiovascular exam     Neuro/Psych Decreased vision 2 to sroke  Neuromuscular disease CVA, Residual Symptoms    GI/Hepatic GERD  Medicated and Controlled,  Endo/Other    Renal/GU      Musculoskeletal   Abdominal   Peds  Hematology  (+) anemia ,   Anesthesia Other Findings Past Medical History: No date: Anemia No date: Barrett's syndrome No date: COPD (chronic obstructive pulmonary disease) (HCC) No date: ED (erectile dysfunction) No date: GERD (gastroesophageal reflux disease) No date: History of blood transfusion No date: HLD (hyperlipidemia) No date: Hypertension No date: Knee pain, right No date: Sciatica No date: 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 Past Surgical History: No date: COLONOSCOPY No date: CYST EXCISION     Comment:  throat No date: UPPER GI ENDOSCOPY BMI    Body Mass Index:  22.96 kg/m     Reproductive/Obstetrics                             Anesthesia Physical Anesthesia Plan  ASA: III  Anesthesia Plan: General LMA   Post-op Pain Management:    Induction:   PONV Risk Score and Plan: Ondansetron and Treatment may vary due to age or medical condition  Airway Management Planned:   Additional Equipment:   Intra-op Plan:   Post-operative Plan:   Informed Consent: I have reviewed the patients History and Physical, chart, labs and discussed the procedure including the risks, benefits and  alternatives for the proposed anesthesia with the patient or authorized representative who has indicated his/her understanding and acceptance.   Dental Advisory Given  Plan Discussed with: CRNA  Anesthesia Plan Comments:        Anesthesia Quick Evaluation

## 2018-01-29 NOTE — Anesthesia Post-op Follow-up Note (Signed)
Anesthesia QCDR form completed.        

## 2018-01-29 NOTE — Discharge Instructions (Signed)
Elbow Surgery Post-Op Instructions   1. Splint/Cast: You will have a splint (3/4 cast) on your arm after surgery. Ensure that this remains clean and dry until follow up appointment. If this becomes wet, you need to call our offices to get it changed or else you risk skin breakdown.     2. Driving:  Plan on not driving for at least 2 weeks. Please note that you are advised NOT to drive while taking narcotic pain medications as you may be impaired and unsafe to drive.   3. Activity: Weight bearing: Non-weight bearing. Use sling for comfort as needed.    4. Medications:  - You have been provided a prescription for narcotic pain medicine. After surgery, take 1-2 narcotic tablets every 4 hours if needed for severe pain. Please start this as soon as you begin to start having pain (if you received a nerve block, start taking as soon as this wears off).  - A prescription for anti-nausea medication will be provided in case the narcotic medicine causes nausea - take 1 tablet every 6 hours only if nauseated.  -Take tylenol 1000 mg every 8 hours for pain.  May stop tylenol 5 days after surgery if you are having minimal pain. -Take ibuprofen 800mg  3x/day for pain and swelling. May stop after 3 days if you are having minimal pain. Please stop taking if this causes GI irritation. Do not take if you have known kidney disease. -Take aspirin 325mg /day x 2 weeks to help prevent DVTs (blood clots).    If you are taking prescription medication for anxiety, depression, insomnia, muscle spasm, chronic pain, or for attention deficit disorder you are advised that you are at a higher risk of adverse effects with use of narcotics post-op, including narcotic addiction/dependence, depressed breathing, death. If you use non-prescribed substances: alcohol, marijuana, cocaine, heroin, methamphetamines, etc., you are at a higher risk of adverse effects with use of narcotics post-op, including narcotic addiction/dependence,  depressed breathing, death. You are advised that taking > 50 morphine milligram equivalents (MME) of narcotic pain medication per day results in twice the risk of overdose or death. For your prescription provided: oxycodone 5 mg - taking more than 6 tablets per day. Be advised that we will prescribe narcotics short-term, for acute post-operative pain only.   6. Physical Therapy: No planned therapy for the first 2 weeks. We can discuss need for PT after 1st follow up visit in 2 weeks.    7. 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 May not return to full work for at least 2 weeks.    8. Post-Op Appointments: Your first post-op appointment will be with Dr. Allena Katz in approximately 2 weeks time.  If you find that they have not been scheduled please call the Orthopaedic Appointment front desk at [phone].   AMBULATORY SURGERY  DISCHARGE INSTRUCTIONS   1) The drugs that you were given will stay in your system until tomorrow so for the next 24 hours you should not:  A) Drive an automobile B) Make any legal decisions C) Drink any alcoholic beverage   2) You may resume regular meals tomorrow.  Today it is better to start with liquids and gradually work up to solid foods.  You may eat anything you prefer, but it is better to start with liquids, then soup and crackers, and gradually work up to solid foods.   3) Please  notify your doctor immediately if you have any unusual bleeding, trouble breathing, redness and pain at the surgery site, drainage, fever, or pain not relieved by medication.    4) Additional Instructions:        Please contact your physician with any problems or Same Day Surgery at [phone], Monday through Friday 6 am to 4 pm, or [place] at [facility] Main number at [phone].

## 2018-01-29 NOTE — H&P (Signed)
Paper H&P to be scanned into permanent record. H&P reviewed. No significant changes noted.  

## 2018-01-30 LAB — SURGICAL PATHOLOGY

## 2018-01-31 NOTE — Anesthesia Postprocedure Evaluation (Signed)
Anesthesia Post Note  Patient: Ronald Watts  Procedure(s) Performed: Beverely RisenLECRANON BURSECTOMY AND  ELBOW DEBRIDMENT (Right Elbow)  Patient location during evaluation: PACU Anesthesia Type: General Level of consciousness: awake and alert Pain management: pain level controlled Vital Signs Assessment: post-procedure vital signs reviewed and stable Respiratory status: spontaneous breathing, nonlabored ventilation, respiratory function stable and patient connected to nasal cannula oxygen Cardiovascular status: blood pressure returned to baseline and stable Postop Assessment: no apparent nausea or vomiting Anesthetic complications: no     Last Vitals:  Vitals:   01/29/18 0951 01/29/18 0957  BP: 114/73 118/83  Pulse: 69   Resp: 15   Temp: (!) 36.1 C   SpO2: 97% 98%    Last Pain:  Vitals:   01/29/18 0951  TempSrc:   PainSc: 0-No pain                 Christia ReadingScott T Asley [place]

## 2019-11-15 ENCOUNTER — Ambulatory Visit: Payer: Medicare Other | Attending: Internal Medicine

## 2020-12-02 ENCOUNTER — Other Ambulatory Visit: Payer: Self-pay | Admitting: Family Medicine

## 2020-12-02 DIAGNOSIS — E782 Mixed hyperlipidemia: Secondary | ICD-10-CM

## 2020-12-02 DIAGNOSIS — R252 Cramp and spasm: Secondary | ICD-10-CM

## 2020-12-02 DIAGNOSIS — R55 Syncope and collapse: Secondary | ICD-10-CM

## 2020-12-02 DIAGNOSIS — I10 Essential (primary) hypertension: Secondary | ICD-10-CM

## 2021-05-02 ENCOUNTER — Inpatient Hospital Stay
Admission: EM | Admit: 2021-05-02 | Discharge: 2021-05-09 | DRG: 439 | Disposition: A | Discharge status: Home / Self Care | Payer: Medicare Other | Attending: Internal Medicine | Admitting: Internal Medicine

## 2021-05-02 ENCOUNTER — Encounter: Payer: Self-pay | Admitting: Emergency Medicine

## 2021-05-02 ENCOUNTER — Other Ambulatory Visit: Payer: Self-pay

## 2021-05-02 DIAGNOSIS — Z23 Encounter for immunization: Secondary | ICD-10-CM

## 2021-05-02 DIAGNOSIS — I10 Essential (primary) hypertension: Secondary | ICD-10-CM | POA: Diagnosis present

## 2021-05-02 DIAGNOSIS — I69398 Other sequelae of cerebral infarction: Secondary | ICD-10-CM

## 2021-05-02 DIAGNOSIS — F10239 Alcohol dependence with withdrawal, unspecified: Secondary | ICD-10-CM | POA: Diagnosis not present

## 2021-05-02 DIAGNOSIS — I739 Peripheral vascular disease, unspecified: Secondary | ICD-10-CM | POA: Diagnosis present

## 2021-05-02 DIAGNOSIS — R7989 Other specified abnormal findings of blood chemistry: Secondary | ICD-10-CM | POA: Diagnosis present

## 2021-05-02 DIAGNOSIS — K852 Alcohol induced acute pancreatitis without necrosis or infection: Principal | ICD-10-CM | POA: Diagnosis present

## 2021-05-02 DIAGNOSIS — K625 Hemorrhage of anus and rectum: Secondary | ICD-10-CM | POA: Diagnosis not present

## 2021-05-02 DIAGNOSIS — Z720 Tobacco use: Secondary | ICD-10-CM | POA: Diagnosis present

## 2021-05-02 DIAGNOSIS — Z79899 Other long term (current) drug therapy: Secondary | ICD-10-CM

## 2021-05-02 DIAGNOSIS — K047 Periapical abscess without sinus: Secondary | ICD-10-CM | POA: Diagnosis present

## 2021-05-02 DIAGNOSIS — Z79891 Long term (current) use of opiate analgesic: Secondary | ICD-10-CM

## 2021-05-02 DIAGNOSIS — I639 Cerebral infarction, unspecified: Secondary | ICD-10-CM | POA: Diagnosis present

## 2021-05-02 DIAGNOSIS — D62 Acute posthemorrhagic anemia: Secondary | ICD-10-CM | POA: Diagnosis present

## 2021-05-02 DIAGNOSIS — K2270 Barrett's esophagus without dysplasia: Secondary | ICD-10-CM | POA: Diagnosis present

## 2021-05-02 DIAGNOSIS — F1010 Alcohol abuse, uncomplicated: Secondary | ICD-10-CM | POA: Diagnosis not present

## 2021-05-02 DIAGNOSIS — D500 Iron deficiency anemia secondary to blood loss (chronic): Secondary | ICD-10-CM

## 2021-05-02 DIAGNOSIS — Z8249 Family history of ischemic heart disease and other diseases of the circulatory system: Secondary | ICD-10-CM

## 2021-05-02 DIAGNOSIS — E871 Hypo-osmolality and hyponatremia: Secondary | ICD-10-CM | POA: Diagnosis present

## 2021-05-02 DIAGNOSIS — E785 Hyperlipidemia, unspecified: Secondary | ICD-10-CM | POA: Diagnosis present

## 2021-05-02 DIAGNOSIS — N529 Male erectile dysfunction, unspecified: Secondary | ICD-10-CM | POA: Diagnosis present

## 2021-05-02 DIAGNOSIS — H548 Legal blindness, as defined in USA: Secondary | ICD-10-CM | POA: Diagnosis present

## 2021-05-02 DIAGNOSIS — M549 Dorsalgia, unspecified: Secondary | ICD-10-CM

## 2021-05-02 DIAGNOSIS — M5416 Radiculopathy, lumbar region: Secondary | ICD-10-CM | POA: Diagnosis present

## 2021-05-02 DIAGNOSIS — K859 Acute pancreatitis without necrosis or infection, unspecified: Secondary | ICD-10-CM | POA: Diagnosis present

## 2021-05-02 DIAGNOSIS — G8929 Other chronic pain: Secondary | ICD-10-CM | POA: Diagnosis present

## 2021-05-02 DIAGNOSIS — F17210 Nicotine dependence, cigarettes, uncomplicated: Secondary | ICD-10-CM | POA: Diagnosis present

## 2021-05-02 DIAGNOSIS — Z833 Family history of diabetes mellitus: Secondary | ICD-10-CM

## 2021-05-02 DIAGNOSIS — K7010 Alcoholic hepatitis without ascites: Secondary | ICD-10-CM | POA: Diagnosis present

## 2021-05-02 DIAGNOSIS — K219 Gastro-esophageal reflux disease without esophagitis: Secondary | ICD-10-CM | POA: Diagnosis present

## 2021-05-02 DIAGNOSIS — Z716 Tobacco abuse counseling: Secondary | ICD-10-CM

## 2021-05-02 DIAGNOSIS — Z7141 Alcohol abuse counseling and surveillance of alcoholic: Secondary | ICD-10-CM

## 2021-05-02 DIAGNOSIS — K760 Fatty (change of) liver, not elsewhere classified: Secondary | ICD-10-CM | POA: Diagnosis present

## 2021-05-02 DIAGNOSIS — S42001A Fracture of unspecified part of right clavicle, initial encounter for closed fracture: Secondary | ICD-10-CM | POA: Diagnosis present

## 2021-05-02 DIAGNOSIS — J449 Chronic obstructive pulmonary disease, unspecified: Secondary | ICD-10-CM | POA: Diagnosis present

## 2021-05-02 DIAGNOSIS — Z20822 Contact with and (suspected) exposure to covid-19: Secondary | ICD-10-CM | POA: Diagnosis present

## 2021-05-02 DIAGNOSIS — K649 Unspecified hemorrhoids: Secondary | ICD-10-CM | POA: Diagnosis present

## 2021-05-02 DIAGNOSIS — R7401 Elevation of levels of liver transaminase levels: Secondary | ICD-10-CM

## 2021-05-02 LAB — CBC
HCT: 33.9 % — ABNORMAL LOW (ref 39.0–52.0)
HCT: 32.9 % — ABNORMAL LOW (ref 39.0–52.0)
Hemoglobin: 11.3 g/dL — ABNORMAL LOW (ref 13.0–17.0)
Hemoglobin: 11.3 g/dL — ABNORMAL LOW (ref 13.0–17.0)
MCH: 28.1 pg (ref 26.0–34.0)
MCH: 29.5 pg (ref 26.0–34.0)
MCHC: 33.3 g/dL (ref 30.0–36.0)
MCHC: 34.3 g/dL (ref 30.0–36.0)
MCV: 84.3 fL (ref 80.0–100.0)
MCV: 85.9 fL (ref 80.0–100.0)
Platelets: 158 10*3/uL (ref 150–400)
Platelets: 153 10*3/uL (ref 150–400)
RBC: 4.02 MIL/uL — ABNORMAL LOW (ref 4.22–5.81)
RBC: 3.83 MIL/uL — ABNORMAL LOW (ref 4.22–5.81)
RDW: 15.6 % — ABNORMAL HIGH (ref 11.5–15.5)
RDW: 15.8 % — ABNORMAL HIGH (ref 11.5–15.5)
WBC: 4.9 10*3/uL (ref 4.0–10.5)
WBC: 4.6 10*3/uL (ref 4.0–10.5)
nRBC: 0 % (ref 0.0–0.2)
nRBC: 0 % (ref 0.0–0.2)

## 2021-05-02 LAB — URINALYSIS, COMPLETE (UACMP) WITH MICROSCOPIC
Bacteria, UA: NONE SEEN
Bilirubin Urine: NEGATIVE
Glucose, UA: NEGATIVE mg/dL
Hgb urine dipstick: NEGATIVE
Ketones, ur: NEGATIVE mg/dL
Leukocytes,Ua: NEGATIVE
Nitrite: NEGATIVE
Protein, ur: NEGATIVE mg/dL
Specific Gravity, Urine: 1.008 (ref 1.005–1.030)
Squamous Epithelial / HPF: NONE SEEN (ref 0–5)
pH: 7 (ref 5.0–8.0)

## 2021-05-02 LAB — COMPREHENSIVE METABOLIC PANEL
ALT: 79 U/L — ABNORMAL HIGH (ref 0–44)
AST: 182 U/L — ABNORMAL HIGH (ref 15–41)
Albumin: 3.8 g/dL (ref 3.5–5.0)
Alkaline Phosphatase: 110 U/L (ref 38–126)
Anion gap: 13 (ref 5–15)
BUN: 6 mg/dL (ref 6–20)
CO2: 26 mmol/L (ref 22–32)
Calcium: 9.4 mg/dL (ref 8.9–10.3)
Chloride: 92 mmol/L — ABNORMAL LOW (ref 98–111)
Creatinine, Ser: 0.85 mg/dL (ref 0.61–1.24)
GFR, Estimated: >60 mL/min (ref >60)
Glucose, Bld: 137 mg/dL — ABNORMAL HIGH (ref 70–99)
Potassium: 3.6 mmol/L (ref 3.5–5.1)
Sodium: 131 mmol/L — ABNORMAL LOW (ref 135–145)
Total Bilirubin: 0.8 mg/dL (ref 0.3–1.2)
Total Protein: 7.6 g/dL (ref 6.5–8.1)

## 2021-05-02 LAB — HEPATITIS PANEL, ACUTE
HCV Ab: NONREACTIVE
Hep A IgM: NONREACTIVE
Hep B C IgM: NONREACTIVE
Hepatitis B Surface Ag: NONREACTIVE

## 2021-05-02 LAB — PROTIME-INR
INR: 0.9 (ref 0.8–1.2)
Prothrombin Time: 12.3 seconds (ref 11.4–15.2)

## 2021-05-02 LAB — LIPASE, BLOOD: Lipase: 189 U/L — ABNORMAL HIGH (ref 11–51)

## 2021-05-02 LAB — RESP PANEL BY RT-PCR (FLU A&B, COVID) ARPGX2
Influenza A by PCR: NEGATIVE
Influenza B by PCR: NEGATIVE
SARS Coronavirus 2 by RT PCR: NEGATIVE

## 2021-05-02 LAB — TRIGLYCERIDES: Triglycerides: 52 mg/dL (ref <150)

## 2021-05-02 LAB — MAGNESIUM: Magnesium: 1.8 mg/dL (ref 1.7–2.4)

## 2021-05-02 LAB — APTT: aPTT: 34 seconds (ref 24–36)

## 2021-05-02 LAB — TYPE AND SCREEN
ABO/RH(D): O NEG
Antibody Screen: NEGATIVE

## 2021-05-02 LAB — VITAMIN B12: Vitamin B-12: 458 pg/mL (ref 180–914)

## 2021-05-02 MED ORDER — LACTATED RINGERS IV BOLUS
1000.0000 mL | Freq: Once | INTRAVENOUS | Status: AC
  Administered 2021-05-02: 1000 mL via INTRAVENOUS

## 2021-05-02 MED ORDER — ONDANSETRON HCL 4 MG/2ML IJ SOLN: 4.0000 mg | Freq: Three times a day (TID) | INTRAMUSCULAR | Status: DC | PRN

## 2021-05-02 MED ORDER — CYCLOBENZAPRINE HCL 10 MG PO TABS
10.0000 mg | ORAL_TABLET | Freq: Every day | ORAL | Status: DC
  Administered 2021-05-02 – 2021-05-04 (×3): 10 mg via ORAL
  Filled 2021-05-02 (×3): qty 1

## 2021-05-02 MED ORDER — PANTOPRAZOLE SODIUM 40 MG IV SOLR
40.0000 mg | Freq: Two times a day (BID) | INTRAVENOUS | Status: DC
  Administered 2021-05-02 – 2021-05-03 (×4): 40 mg via INTRAVENOUS
  Filled 2021-05-02 (×5): qty 40

## 2021-05-02 MED ORDER — HYDROCHLOROTHIAZIDE 12.5 MG PO TABS
12.5000 mg | ORAL_TABLET | Freq: Every day | ORAL | Status: DC
  Administered 2021-05-02 – 2021-05-09 (×8): 12.5 mg via ORAL
  Filled 2021-05-02 (×8): qty 1

## 2021-05-02 MED ORDER — MORPHINE SULFATE (PF) 2 MG/ML IV SOLN: 2.0000 mg | INTRAVENOUS | Status: DC | PRN

## 2021-05-02 MED ORDER — SODIUM CHLORIDE 0.9 % IV SOLN: INTRAVENOUS | Status: DC

## 2021-05-02 MED ORDER — NICOTINE 21 MG/24HR TD PT24
21.0000 mg | MEDICATED_PATCH | Freq: Every day | TRANSDERMAL | Status: DC
  Administered 2021-05-02 – 2021-05-09 (×8): 21 mg via TRANSDERMAL
  Filled 2021-05-02 (×8): qty 1

## 2021-05-02 MED ORDER — CHLORDIAZEPOXIDE HCL 5 MG PO CAPS
10.0000 mg | ORAL_CAPSULE | Freq: Three times a day (TID) | ORAL | Status: DC
  Administered 2021-05-02: 10 mg via ORAL
  Filled 2021-05-02: qty 2

## 2021-05-02 MED ORDER — THIAMINE HCL 100 MG PO TABS
100.0000 mg | ORAL_TABLET | Freq: Every day | ORAL | Status: DC
  Administered 2021-05-03 – 2021-05-09 (×7): 100 mg via ORAL
  Filled 2021-05-02 (×7): qty 1

## 2021-05-02 MED ORDER — SODIUM CHLORIDE 0.9 % IV SOLN: Freq: Once | INTRAVENOUS | Status: AC

## 2021-05-02 MED ORDER — THIAMINE HCL 100 MG/ML IJ SOLN
100.0000 mg | Freq: Every day | INTRAMUSCULAR | Status: DC
  Filled 2021-05-02: qty 2

## 2021-05-02 MED ORDER — LORAZEPAM 2 MG/ML IJ SOLN
1.0000 mg | Freq: Once | INTRAMUSCULAR | Status: AC
  Administered 2021-05-02: 1 mg via INTRAVENOUS
  Filled 2021-05-02: qty 1

## 2021-05-02 MED ORDER — GABAPENTIN 100 MG PO CAPS
100.0000 mg | ORAL_CAPSULE | Freq: Three times a day (TID) | ORAL | Status: DC
  Administered 2021-05-02 – 2021-05-09 (×21): 100 mg via ORAL
  Filled 2021-05-02 (×21): qty 1

## 2021-05-02 MED ORDER — ALBUTEROL SULFATE (2.5 MG/3ML) 0.083% IN NEBU: 3.0000 mL | INHALATION_SOLUTION | RESPIRATORY_TRACT | Status: DC | PRN

## 2021-05-02 MED ORDER — HYDRALAZINE HCL 20 MG/ML IJ SOLN: 5.0000 mg | INTRAMUSCULAR | Status: DC | PRN

## 2021-05-02 MED ORDER — LISINOPRIL 20 MG PO TABS
20.0000 mg | ORAL_TABLET | Freq: Every day | ORAL | Status: DC
  Administered 2021-05-02 – 2021-05-05 (×4): 20 mg via ORAL
  Filled 2021-05-02 (×4): qty 1

## 2021-05-02 MED ORDER — LORAZEPAM 2 MG PO TABS: 0.0000 mg | ORAL_TABLET | Freq: Two times a day (BID) | ORAL | Status: DC

## 2021-05-02 MED ORDER — LORAZEPAM 2 MG/ML IJ SOLN
0.0000 mg | Freq: Four times a day (QID) | INTRAMUSCULAR | Status: AC
  Filled 2021-05-02: qty 1

## 2021-05-02 MED ORDER — GABAPENTIN 600 MG PO TABS
600.0000 mg | ORAL_TABLET | Freq: Three times a day (TID) | ORAL | Status: DC
  Administered 2021-05-02 – 2021-05-09 (×21): 600 mg via ORAL
  Filled 2021-05-02 (×21): qty 1

## 2021-05-02 MED ORDER — CHLORDIAZEPOXIDE HCL 25 MG PO CAPS: 25.0000 mg | ORAL_CAPSULE | Freq: Three times a day (TID) | ORAL | Status: DC

## 2021-05-02 MED ORDER — INFLUENZA VAC SPLIT QUAD 0.5 ML IM SUSY
0.5000 mL | PREFILLED_SYRINGE | INTRAMUSCULAR | Status: AC
  Administered 2021-05-05: 0.5 mL via INTRAMUSCULAR
  Filled 2021-05-02 (×2): qty 0.5

## 2021-05-02 MED ORDER — THIAMINE HCL 100 MG/ML IJ SOLN
100.0000 mg | Freq: Once | INTRAMUSCULAR | Status: AC
  Administered 2021-05-02: 100 mg via INTRAVENOUS
  Filled 2021-05-02: qty 2

## 2021-05-02 MED ORDER — MAGNESIUM OXIDE 400 MG PO TABS
400.0000 mg | ORAL_TABLET | Freq: Two times a day (BID) | ORAL | Status: DC
  Administered 2021-05-02 – 2021-05-09 (×14): 400 mg via ORAL
  Filled 2021-05-02 (×29): qty 1

## 2021-05-02 MED ORDER — DM-GUAIFENESIN ER 30-600 MG PO TB12
1.0000 | ORAL_TABLET | Freq: Two times a day (BID) | ORAL | Status: DC | PRN
  Administered 2021-05-06 – 2021-05-08 (×2): 1 via ORAL
  Filled 2021-05-02 (×2): qty 1

## 2021-05-02 MED ORDER — LORAZEPAM 2 MG/ML IJ SOLN: 0.0000 mg | Freq: Two times a day (BID) | INTRAMUSCULAR | Status: DC

## 2021-05-02 MED ORDER — LORAZEPAM 2 MG PO TABS
0.0000 mg | ORAL_TABLET | Freq: Four times a day (QID) | ORAL | Status: AC
  Administered 2021-05-03 (×3): 2 mg via ORAL
  Administered 2021-05-04: 05:00:00 1 mg via ORAL
  Filled 2021-05-02 (×4): qty 1

## 2021-05-02 MED ORDER — AMOXICILLIN 250 MG/5ML PO SUSR
875.0000 mg | Freq: Two times a day (BID) | ORAL | Status: AC
  Administered 2021-05-02 – 2021-05-08 (×13): 875 mg via ORAL
  Filled 2021-05-02: qty 20
  Filled 2021-05-02: qty 17.5
  Filled 2021-05-02 (×5): qty 20
  Filled 2021-05-02 (×2): qty 17.5
  Filled 2021-05-02 (×5): qty 20
  Filled 2021-05-02 (×2): qty 17.5
  Filled 2021-05-02: qty 20
  Filled 2021-05-02: qty 17.5
  Filled 2021-05-02 (×8): qty 20

## 2021-05-02 MED ORDER — FOLIC ACID 1 MG PO TABS
1.0000 mg | ORAL_TABLET | Freq: Once | ORAL | Status: AC
  Administered 2021-05-02: 1 mg via ORAL
  Filled 2021-05-02: qty 1

## 2021-05-02 MED ORDER — LISINOPRIL-HYDROCHLOROTHIAZIDE 20-12.5 MG PO TABS: 1.0000 | ORAL_TABLET | Freq: Every day | ORAL | Status: DC

## 2021-05-02 MED ORDER — LORAZEPAM 2 MG/ML IJ SOLN
2.0000 mg | Freq: Once | INTRAMUSCULAR | Status: AC
  Administered 2021-05-02: 19:00:00 2 mg via INTRAVENOUS

## 2021-05-02 NOTE — Consult Note (Signed)
[facility] Gastroenterology Inpatient Consultation   Patient ID: Ronald Watts is a 55 y.o. male.  Requesting Provider: Lorretta Harp, MD  Date of Admission: 05/02/2021  Date of Consult: 05/02/21   Reason for Consultation: rectal bleeding, acute alcoholic pancreatitis, anemia, etoh abuse   Patient's Chief Complaint:   Chief Complaint  Patient presents with   Rectal Bleeding   Withdrawal   Abdominal Pain    55 y/o CM c Barretts Esophagus (2006), GERD, EtOH Abuse, Hemorrhoids, anemia, stroke, COPD, HTN, HLD who presents with BRBPR and hoping for etoh detox. GI consulted for BRBPR, Acute pancreatitis, and alcoholic hepatitis  Pt has had chronic intermittent bright red blood per rectum and noted so over the last month. Notes it has been worse over the last couple days. Denies melena. No pain with defecation. Has increased acute on chronic upper abdominal discomfort. No fever or chills. No dysphagia/odynophagia, nausea or vomiting, coffee ground emesis or hematemesis. Notes fatigue and weakness specifically in his legs. No weight loss. No other red flag symptoms  BM otherwise regular- no constipation or diarrhea. Does not report straining with BM.  Has had h/o hemorrhoidectomy  Currently drinks 12x12oz cans of beer per day, smokes tobacco 2ppd and marijuana. Denies other drug use. Last alcoholic drink was yesterday evening.  Denies NSAIDs, Anti-plt agents, and anticoagulants Denies family history of gastrointestinal disease and malignancy Previous Endoscopies: EGD and Colonoscopy in 09/2001 and 10/2004 with noted barretts esophagus and hemorrhoids    Past Medical History:  Diagnosis Date   Anemia    Barrett's syndrome    COPD (chronic obstructive pulmonary disease) (HCC)    ED (erectile dysfunction)    GERD (gastroesophageal reflux disease)    History of blood transfusion    HLD (hyperlipidemia)    Hypertension    Knee pain, right    Sciatica    Stroke (HCC)    optic nerve    Past  Surgical History:  Procedure Laterality Date   COLONOSCOPY     CYST EXCISION     throat   OLECRANON BURSECTOMY Right 01/29/2018   Procedure: OLECRANON BURSECTOMY AND  ELBOW DEBRIDMENT;  Surgeon: Signa Kell, MD;  Location: [facility] ORS;  Service: Orthopedics;  Laterality: Right;   UPPER GI ENDOSCOPY      No Known Allergies  Family History  Problem Relation Age of Onset   Cancer Father    Hypertension Father    Hypertension Mother    Diabetes Mother    Social History Currently drinks 12x12oz cans of beer per day, smokes tobacco 2ppd and marijuana. Denies other drug use. Last alcoholic drink was yesterday evening.   Pertinent GI related history and allergies were reviewed with the patient  Review of Systems  Constitutional:  Positive for fatigue. Negative for activity change, appetite change, chills, fever and unexpected weight change.  HENT:  Negative for trouble swallowing and voice change.   Respiratory:  Negative for shortness of breath.   Cardiovascular:  Negative for chest pain and palpitations.  Gastrointestinal:  Positive for abdominal pain and anal bleeding. Negative for abdominal distention, blood in stool, constipation, diarrhea, nausea and vomiting.  Musculoskeletal:  Negative for arthralgias and myalgias.  Skin:  Negative for color change and pallor.  Neurological:  Positive for weakness. Negative for dizziness and syncope.  Psychiatric/Behavioral:  Negative for confusion. The patient is not nervous/anxious.   All other systems reviewed and are negative.   Medications Home Medications No current facility-administered medications on file prior to encounter.  Current Outpatient Medications on File Prior to Encounter  Medication Sig Dispense Refill   atorvastatin (LIPITOR) 40 MG tablet Take 40 mg by mouth daily.     cyclobenzaprine (FLEXERIL) 10 MG tablet Take 1 tablet by mouth at bedtime.      gabapentin (NEURONTIN) 100 MG capsule Take 1 capsule by mouth 3 (three)  times daily.     gabapentin (NEURONTIN) 600 MG tablet Take 1 tablet by mouth 3 (three) times daily.     lisinopril (PRINIVIL,ZESTRIL) 10 MG tablet Take 10 mg by mouth daily.     lisinopril-hydrochlorothiazide (PRINZIDE,ZESTORETIC) 20-12.5 MG tablet Take 1 tablet by mouth daily.     omeprazole (PRILOSEC) 20 MG capsule Take 1 capsule by mouth daily.     ondansetron (ZOFRAN ODT) 4 MG disintegrating tablet Take 1 tablet (4 mg total) by mouth every 8 (eight) hours as needed for nausea or vomiting. 20 tablet 0   sildenafil (VIAGRA) 100 MG tablet Take 100 mg by mouth daily as needed for erectile dysfunction.     traMADol (ULTRAM) 50 MG tablet Take 1 tablet (50 mg total) by mouth every 6 (six) hours as needed. 10 tablet 0   Pertinent GI related medications were reviewed with the patient  Inpatient Medications  Current Facility-Administered Medications:    0.9 %  sodium chloride infusion, , Intravenous, Continuous, Lorretta Harp, MD, Last Rate: 100 mL/hr at 05/02/21 1136, Rate Change at 05/02/21 1136   albuterol (PROVENTIL) (2.5 MG/3ML) 0.083% nebulizer solution 3 mL, 3 mL, Inhalation, Q4H PRN, Lorretta Harp, MD   dextromethorphan-guaiFENesin (MUCINEX DM) 30-600 MG per 12 hr tablet 1 tablet, 1 tablet, Oral, BID PRN, Lorretta Harp, MD   hydrALAZINE (APRESOLINE) injection 5 mg, 5 mg, Intravenous, Q2H PRN, Lorretta Harp, MD   LORazepam (ATIVAN) injection 0-4 mg, 0-4 mg, Intravenous, Q6H **OR** LORazepam (ATIVAN) tablet 0-4 mg, 0-4 mg, Oral, Q6H, Minna Antis, MD   Melene Muller ON 05/04/2021] LORazepam (ATIVAN) injection 0-4 mg, 0-4 mg, Intravenous, Q12H **OR** [START ON 05/04/2021] LORazepam (ATIVAN) tablet 0-4 mg, 0-4 mg, Oral, Q12H, Minna Antis, MD   morphine 2 MG/ML injection 2 mg, 2 mg, Intravenous, Q4H PRN, Lorretta Harp, MD   nicotine (NICODERM CQ - dosed in mg/24 hours) patch 21 mg, 21 mg, Transdermal, Daily, Lorretta Harp, MD   ondansetron [facility]) injection 4 mg, 4 mg, Intravenous, Q8H PRN, Lorretta Harp, MD    pantoprazole (PROTONIX) injection 40 mg, 40 mg, Intravenous, Q12H, Lorretta Harp, MD   thiamine tablet 100 mg, 100 mg, Oral, Daily **OR** thiamine (B-1) injection 100 mg, 100 mg, Intravenous, Daily, Minna Antis, MD  Current Outpatient Medications:    atorvastatin (LIPITOR) 40 MG tablet, Take 40 mg by mouth daily., Disp: , Rfl:    cyclobenzaprine (FLEXERIL) 10 MG tablet, Take 1 tablet by mouth at bedtime. , Disp: , Rfl:    gabapentin (NEURONTIN) 100 MG capsule, Take 1 capsule by mouth 3 (three) times daily., Disp: , Rfl:    gabapentin (NEURONTIN) 600 MG tablet, Take 1 tablet by mouth 3 (three) times daily., Disp: , Rfl:    lisinopril (PRINIVIL,ZESTRIL) 10 MG tablet, Take 10 mg by mouth daily., Disp: , Rfl:    lisinopril-hydrochlorothiazide (PRINZIDE,ZESTORETIC) 20-12.5 MG tablet, Take 1 tablet by mouth daily., Disp: , Rfl:    omeprazole (PRILOSEC) 20 MG capsule, Take 1 capsule by mouth daily., Disp: , Rfl:    ondansetron (ZOFRAN ODT) 4 MG disintegrating tablet, Take 1 tablet (4 mg total) by mouth every 8 (eight) hours as needed for  nausea or vomiting., Disp: 20 tablet, Rfl: 0   sildenafil (VIAGRA) 100 MG tablet, Take 100 mg by mouth daily as needed for erectile dysfunction., Disp: , Rfl:    traMADol (ULTRAM) 50 MG tablet, Take 1 tablet (50 mg total) by mouth every 6 (six) hours as needed., Disp: 10 tablet, Rfl: 0  sodium chloride 100 mL/hr at 05/02/21 1136    albuterol, dextromethorphan-guaiFENesin, hydrALAZINE, morphine injection, ondansetron (ZOFRAN) IV   Objective   Vitals:   05/02/21 0830 05/02/21 0900 05/02/21 1000 05/02/21 1200  BP: 117/87 (!) 151/85 122/90 (!) 121/97  Pulse: 77 69 79 71  Resp: 17 16 15 17   Temp:    98.3 F (36.8 C)  TempSrc:      SpO2: 96% 99% 97% 98%  Weight:      Height:         Physical Exam Vitals and nursing note reviewed.  Constitutional:      General: He is not in acute distress.    Appearance: Normal appearance. He is not toxic-appearing or  diaphoretic.     Comments: Appears older than stated age  HENT:     Head: Normocephalic and atraumatic.     Nose: Nose normal.     Mouth/Throat:     Mouth: Mucous membranes are moist.     Pharynx: Oropharynx is clear.     Comments: Missing some teeth Eyes:     General: No scleral icterus.    Extraocular Movements: Extraocular movements intact.  Cardiovascular:     Rate and Rhythm: Normal rate and regular rhythm.     Heart sounds: Normal heart sounds. No murmur heard.   No friction rub. No gallop.  Pulmonary:     Effort: Pulmonary effort is normal. No respiratory distress.     Breath sounds: Normal breath sounds. No wheezing, rhonchi or rales.  Abdominal:     General: Abdomen is flat. Bowel sounds are normal. There is no distension.     Palpations: Abdomen is soft.     Tenderness: There is abdominal tenderness (mild). There is no guarding or rebound.  Genitourinary:    Comments: Per EM report- blood noted on rectal exam Musculoskeletal:     Cervical back: Neck supple.     Right lower leg: No edema.     Left lower leg: No edema.  Skin:    General: Skin is warm and dry.     Coloration: Skin is not jaundiced or pale.  Neurological:     General: No focal deficit present.     Mental Status: He is alert and oriented to person, place, and time. Mental status is at baseline.  Psychiatric:        Mood and Affect: Mood normal.        Behavior: Behavior normal.        Thought Content: Thought content normal.        Judgment: Judgment normal.    Laboratory Data Recent Labs  Lab 05/02/21 0728  WBC 4.9  HGB 11.3*  HCT 33.9*  PLT 158   Recent Labs  Lab 05/02/21 0728  NA 131*  K 3.6  CL 92*  CO2 26  BUN 6  CALCIUM 9.4  PROT 7.6  BILITOT 0.8  ALKPHOS 110  ALT 79*  AST 182*  GLUCOSE 137*   No results for input(s): INR in the last 168 hours.  Recent Labs    05/02/21 0728  LIPASE 189*        Imaging Studies: No  results found.  Assessment:  # Bright red  blood per rectum- suspect hemorrhoidal vs diverticular - chronic; worse over last couple days - noted by EM provider on rectal exam  # Alcoholic pancreatitis - epigastric pain with lipase >3x ULN of test  #Alcoholic Hepatitis - AST ALT  # EtOH Abuse  # Tobacco dependence  # Chronic anemia; microcytic - was 9.7 in 2019; now 11.3 on presentation; mcv 84.3   # Hyponatremia- likely 2/2 etoh abuse  # Barretts Esophagus- dx 2006 per chart review  # chronic comorbidities: HTN, HLD, h/o stroke, COPD, GERD  Plan:  Recommend outpatient EGD and Colonoscopy for when patient recovers from acute alcoholic pancreatitis/hepatitis. Pt and wife at bedside agreeable to this plan Will order Liver ultrasound with doppler Monitor H&H; transfusion and resuscitation as per primary team  Maintain two iv sites Correction of electrolytes as per primary team Collect iron studies, folate, b12 for anemia w/u; triglycerides wnl for pancreatitis INR pending for [facility] Given h/o barretts- recommend once daily po ppi to go home with (already taking at home) CIWA protocol for etoh withdrawal per primary team Alcohol cessation recommended Supportive care including anti emetics and pain control as per primary team LR IVF for acute pancreatitis Patient ok to eat from GI perspective as soon as he is able- recommend high protein diet given alcoholic hepatitis Viral hepatitis panel pending If patient has increased bleeding and becomes hemodynamically unstable, recommend CTA for bleed localization  I personally performed the service.  Management of other medical comorbidities as per primary team  Thank you for allowing [place] to participate in this patient's care. Please don't hesitate to call if any questions or concerns arise.   [name], DO [facility] Gastroenterology  Portions of the record may have been created with voice recognition software. Occasional wrong-word or 'sound-a-like'  substitutions may have occurred due to the inherent limitations of voice recognition software.  Read the chart carefully and recognize, using context, where substitutions may have occurred.

## 2021-05-02 NOTE — H&P (Addendum)
History and Physical    Ronald Watts JEH:[id number] DOB: 1966/06/20 DOA: 05/02/2021  Referring MD/NP/PA:   PCP: Leanna Sato, MD   Patient coming from:  The patient is coming from home.  At baseline, pt is independent for most of ADL.        Chief Complaint: rectal bleeding  HPI: Ronald Watts is a 55 y.o. male with medical history significant of alcohol abuse, tobacco abuse, hypertension, hyperlipidemia, COPD, stroke, GERD, anemia, sciatica, blood transfusion due to GI bleeding, who presents with rectal bleeding.  Pt state that he has intermittent rectal bleeding for about 1 month, initially with dark stool, then becomes bright red in the past several days.  Patient also reports nausea ,vomiting and abdominal pain.  No diarrhea.  He has nonbilious nonbloody vomiting several times each day.  His abdominal pain is located epigastric area, intermittent, mild, aching, nonradiating.  Patient does not have chest pain, cough, shortness breath.  He reports mild dysuria sometimes, no burning on urination, increased urinary frequency or hematuria. Denies NSAIDs, Anti-plt agents, and anticoagulants. Pt states that he has left upper dental infection, was started on amoxicillin in urgent care yesterday.   ED Course: pt was found to have hemoglobin 11.3 (9.7 on 01/25/2018), lipase 189, negative COVID PCR, positive FOBT, abnormal liver function (ALP 110, AST 182, AST 79, total bilirubin 0.8), renal function okay, temperature normal, blood pressure 122/90, heart rate 99, RR 20, oxygen saturation 96% on room air.  Patient is placed on MedSurg bed for position, Dr. Timothy Lasso of GI is consulted.   Review of Systems:   General: no fevers, chills, no body weight gain, has poor appetite, has fatigue HEENT: no blurry vision, hearing changes or sore throat. Has mild swelling in right face. Respiratory: no dyspnea, coughing, wheezing CV: no chest pain, no palpitations GI: has nausea, vomiting, abdominal pain, no  diarrhea, constipation GU: has dysuria, no burning on urination, increased urinary frequency, hematuria  Ext: no leg edema Neuro: no unilateral weakness, numbness, or tingling, no vision change or hearing loss Skin: no rash, no skin tear. MSK: No muscle spasm, no deformity, no limitation of range of movement in spin Heme: No easy bruising.  Travel history: No recent long distant travel.  Allergy: No Known Allergies  Past Medical History:  Diagnosis Date   Anemia    Barrett's syndrome    COPD (chronic obstructive pulmonary disease) (HCC)    ED (erectile dysfunction)    GERD (gastroesophageal reflux disease)    History of blood transfusion    HLD (hyperlipidemia)    Hypertension    Knee pain, right    Sciatica    Stroke (HCC)    optic nerve    Past Surgical History:  Procedure Laterality Date   COLONOSCOPY     CYST EXCISION     throat   OLECRANON BURSECTOMY Right 01/29/2018   Procedure: OLECRANON BURSECTOMY AND  ELBOW DEBRIDMENT;  Surgeon: Signa Kell, MD;  Location: [facility] ORS;  Service: Orthopedics;  Laterality: Right;   UPPER GI ENDOSCOPY      Social History:  reports that he has been smoking cigarettes. He has a 30.00 pack-year smoking history. He has never used smokeless tobacco. He reports current alcohol use of about 6.0 standard drinks per week. He reports current drug use. Drug: Marijuana.  Family History:  Family History  Problem Relation Age of Onset   Cancer Father    Hypertension Father    Hypertension Mother    Diabetes  Mother      Prior to Admission medications   Medication Sig Start Date End Date Taking? Authorizing Provider  atorvastatin (LIPITOR) 40 MG tablet Take 40 mg by mouth daily.    [provider]  cyclobenzaprine (FLEXERIL) 10 MG tablet Take 1 tablet by mouth at bedtime.  01/13/15   [provider]  gabapentin (NEURONTIN) 100 MG capsule Take 1 capsule by mouth 3 (three) times daily. 01/13/15   [provider]   gabapentin (NEURONTIN) 600 MG tablet Take 1 tablet by mouth 3 (three) times daily. 01/13/15   [provider]  lisinopril (PRINIVIL,ZESTRIL) 10 MG tablet Take 10 mg by mouth daily.    [provider]  lisinopril-hydrochlorothiazide (PRINZIDE,ZESTORETIC) 20-12.5 MG tablet Take 1 tablet by mouth daily. 01/25/18   [provider]  omeprazole (PRILOSEC) 20 MG capsule Take 1 capsule by mouth daily. 01/13/15   [provider]  ondansetron (ZOFRAN ODT) 4 MG disintegrating tablet Take 1 tablet (4 mg total) by mouth every 8 (eight) hours as needed for nausea or vomiting. 01/29/18   Signa Kell, MD  sildenafil (VIAGRA) 100 MG tablet Take 100 mg by mouth daily as needed for erectile dysfunction.    [provider]  traMADol (ULTRAM) 50 MG tablet Take 1 tablet (50 mg total) by mouth every 6 (six) hours as needed. 11/19/17   Racheal Patches, PA-C    Physical Exam: Vitals:   05/02/21 0900 05/02/21 1000 05/02/21 1200 05/02/21 1339  BP: (!) 151/85 122/90 (!) 121/97 (!) 145/100  Pulse: 69 79 71 70  Resp: 16 15 17 16   Temp:   98.3 F (36.8 C) (!) 97.5 F (36.4 C)  TempSrc:    Oral  SpO2: 99% 97% 98% 99%  Weight:      Height:       General: Not in acute distress HEENT: has mild swelling in right face.       Eyes: PERRL, EOMI, no scleral icterus.       ENT: No discharge from the ears and nose, no pharynx injection, no tonsillar enlargement.        Neck: No JVD, no bruit, no mass felt. Heme: No neck lymph node enlargement. Cardiac: S1/S2, RRR, No murmurs, No gallops or rubs. Respiratory: No rales, wheezing, rhonchi or rubs. GI: Soft, nondistended, has mild tenderness in upper and middle abdomen. no rebound pain, no organomegaly, BS present. GU: No hematuria Ext: No pitting leg edema bilaterally. 1+DP/PT pulse bilaterally. Musculoskeletal: No joint deformities, No joint redness or warmth, no limitation of ROM in spin. Skin: No rashes.  Neuro: Alert,  oriented X3, cranial nerves II-XII grossly intact, moves all extremities normally.  Psych: Patient is not psychotic, no suicidal or hemocidal ideation.  Labs on Admission: I have personally reviewed following labs and imaging studies  CBC: Recent Labs  Lab 05/02/21 0728 05/02/21 1409  WBC 4.9 4.6  HGB 11.3* 11.3*  HCT 33.9* 32.9*  MCV 84.3 85.9  PLT 158 153   Basic Metabolic Panel: Recent Labs  Lab 05/02/21 0728  NA 131*  K 3.6  CL 92*  CO2 26  GLUCOSE 137*  BUN 6  CREATININE 0.85  CALCIUM 9.4   GFR: Estimated Creatinine Clearance: 92.5 mL/min (by C-G formula based on SCr of 0.85 mg/dL). Liver Function Tests: Recent Labs  Lab 05/02/21 0728  AST 182*  ALT 79*  ALKPHOS 110  BILITOT 0.8  PROT 7.6  ALBUMIN 3.8   Recent Labs  Lab 05/02/21 [phone]  LIPASE 189*   No results for input(s): AMMONIA in the last 168 hours. Coagulation Profile: Recent Labs  Lab 05/02/21 1409  INR 0.9   Cardiac Enzymes: No results for input(s): CKTOTAL, CKMB, CKMBINDEX, TROPONINI in the last 168 hours. BNP (last 3 results) No results for input(s): PROBNP in the last 8760 hours. HbA1C: No results for input(s): HGBA1C in the last 72 hours. CBG: No results for input(s): GLUCAP in the last 168 hours. Lipid Profile: Recent Labs    05/02/21 1201  TRIG 52   Thyroid Function Tests: No results for input(s): TSH, T4TOTAL, FREET4, T3FREE, THYROIDAB in the last 72 hours. Anemia Panel: No results for input(s): VITAMINB12, FOLATE, FERRITIN, TIBC, IRON, RETICCTPCT in the last 72 hours. Urine analysis:    Component Value Date/Time   COLORURINE YELLOW (A) 05/02/2021 1303   APPEARANCEUR CLEAR (A) 05/02/2021 1303   LABSPEC 1.008 05/02/2021 1303   PHURINE 7.0 05/02/2021 1303   GLUCOSEU NEGATIVE 05/02/2021 1303   HGBUR NEGATIVE 05/02/2021 1303   BILIRUBINUR NEGATIVE 05/02/2021 1303   KETONESUR NEGATIVE 05/02/2021 1303   PROTEINUR NEGATIVE 05/02/2021 1303   NITRITE NEGATIVE 05/02/2021  1303   LEUKOCYTESUR NEGATIVE 05/02/2021 1303   Sepsis Labs: @LABRCNTIP (procalcitonin:4,lacticidven:4) ) Recent Results (from the past 240 hour(s))  Resp Panel by RT-PCR (Flu A&B, Covid) Nasopharyngeal Swab     Status: None   Collection Time: 05/02/21 10:13 AM   Specimen: Nasopharyngeal Swab; Nasopharyngeal(NP) swabs in vial transport medium  Result Value Ref Range Status   SARS Coronavirus 2 by RT PCR NEGATIVE NEGATIVE Final    Comment: (NOTE) SARS-CoV-2 target nucleic acids are NOT DETECTED.  The SARS-CoV-2 RNA is generally detectable in upper respiratory specimens during the acute phase of infection. The lowest concentration of SARS-CoV-2 viral copies this assay can detect is 138 copies/mL. A negative result does not preclude SARS-Cov-2 infection and should not be used as the sole basis for treatment or other patient management decisions. A negative result may occur with  improper specimen collection/handling, submission of specimen other than nasopharyngeal swab, presence of viral mutation(s) within the areas targeted by this assay, and inadequate number of viral copies(<138 copies/mL). A negative result must be combined with clinical observations, patient history, and epidemiological information. The expected result is Negative.  Fact Sheet for Patients:  05/04/21  Fact Sheet for Healthcare Providers:  BloggerCourse.com  This test is no t yet approved or cleared by the [employer] FDA and  has been authorized for detection and/or diagnosis of SARS-CoV-2 by FDA under an Emergency Use Authorization (EUA). This EUA will remain  in effect (meaning this test can be used) for the duration of the COVID-19 declaration under Section 564(b)(1) of the Act, 21 U.S.C.section 360bbb-3(b)(1), unless the authorization is terminated  or revoked sooner.       Influenza A by PCR NEGATIVE NEGATIVE Final   Influenza B by PCR  NEGATIVE NEGATIVE Final    Comment: (NOTE) The Xpert Xpress SARS-CoV-2/FLU/RSV plus assay is intended as an aid in the diagnosis of influenza from Nasopharyngeal swab specimens and should not be used as a sole basis for treatment. Nasal washings and aspirates are unacceptable for Xpert Xpress SARS-CoV-2/FLU/RSV testing.  Fact Sheet for Patients: [place]  Fact Sheet for Healthcare Providers: BloggerCourse.com  This test is not yet approved or cleared by the [employer] FDA and has been authorized for detection and/or diagnosis of SARS-CoV-2 by FDA under an Emergency Use Authorization (EUA). This EUA will remain in effect (meaning this test can  be used) for the duration of the COVID-19 declaration under Section 564(b)(1) of the Act, 21 U.S.C. section 360bbb-3(b)(1), unless the authorization is terminated or revoked.  Performed at [facility], [address], [place], [place] [address]      Radiological Exams on Admission: No results found.   EKG: I have personally reviewed.  Sinus rhythm, QTC 432, incomplete right bundle blockage, PVC  Assessment/Plan Principal Problem:   Rectal bleeding Active Problems:   Hypertension   HLD (hyperlipidemia)   GERD (gastroesophageal reflux disease)   COPD (chronic obstructive pulmonary disease) (HCC)   Anemia due to blood loss   Stroke [facility])   Alcohol abuse   Tobacco abuse   Alcoholic pancreatitis   Abnormal LFTs   Dental infection     Rectal bleeding and anemia due to blood loss: Hgb 9.7 on 01/25/18 --> 11.3 --> 11.3. Dr. Timothy Lasso was consulted.  No urgent endoscopy needed.  Likely need outpatient colonoscopy and EGD.  - will admitted to med-surg bed as inpatient - IVF: 1L LR bolus, then at 100 mL/hr - Start IV pantoprazole 40 mg bid - Zofran IV for nausea - Avoid NSAIDs and SQ heparin - Maintain IV access (2 large bore IVs if possible). - Monitor closely and  follow q6h cbc, transfuse as necessary, if Hgb<7.0 - LaB: INR, PTT and type screen - Anemia panel  Hypertension -IV hydralazine as needed -Continue home prinzide  HLD (hyperlipidemia): Patient's not taking his Lipitor currently -Follow-up with PCP  GERD (gastroesophageal reflux disease) -On IV Protonix  COPD (chronic obstructive pulmonary disease) (HCC): Stable -Bronchodilators  History of stroke [facility]): Not taking medications currently -No acute issues, follow-up with PCP  Tobacco abuse and Alcohol abuse: -Did counseling about importance of quitting smoking -Nicotine patch -Did counseling about the importance of quitting drinking -CIWA protocol -librium 10 mg tid  Alcoholic pancreatitis: Lipase 048.  TG level 52 -As needed morphine and Zofran -IV fluid as above -Clear liquid today, advance diet as tolerated  Abnormal LFTs: Most likely due to alcohol abuse -Avoid using Tylenol -Check hepatitis panel, HIV antibody  Dental infection: No fever or leukocytosis.  Clinically not septic. -Continue home amoxicillin     DVT ppx: SCD Code Status: Full code Family Communication:   Yes, patient's wife at bed side Disposition Plan:  Anticipate discharge back to previous environment Consults called:  Dr. Timothy Lasso of GI Admission status and Level of care: Med-Surg:   for obs   Status is: Observation  The patient remains OBS appropriate and will d/c before 2 midnights.         Date of Service 05/02/2021    Ronald Watts [facility]   If 7PM-7AM, please contact night-coverage [email or web address] 05/02/2021, 3:28 PM

## 2021-05-02 NOTE — ED Provider Notes (Signed)
[facility] Emergency Department Provider Note  Time seen: 9:02 AM  I have reviewed the triage vital signs and the nursing notes.   HISTORY  Chief Complaint Rectal Bleeding, Withdrawal, and Abdominal Pain   HPI Ronald Watts is a 55 y.o. male with a past medical history of anemia, COPD, gastric reflux, hypertension, hyperlipidemia, alcohol abuse, presents to the emergency department for rectal bleeding and hoping for alcohol detox.  According to the patient for the past month or so he has been experiencing intermittent rectal bleeding with bowel movements.  Patient states he has a history of rectal bleeding previously due to a bleeding hemorrhoid requiring a blood transfusion years ago.  Patient denies any black stool denies any vomiting.  States mild upper abdominal pain which she states is fairly chronic.  No fever.  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 Authorizing Provider  atorvastatin (LIPITOR) 40 MG tablet Take 40 mg by mouth daily.    [provider]  cyclobenzaprine (FLEXERIL) 10 MG tablet Take 1 tablet by mouth at bedtime.  01/13/15   [provider]  gabapentin  (NEURONTIN) 100 MG capsule Take 1 capsule by mouth 3 (three) times daily. 01/13/15   [provider]  gabapentin (NEURONTIN) 600 MG tablet Take 1 tablet by mouth 3 (three) times daily. 01/13/15   [provider]  lisinopril (PRINIVIL,ZESTRIL) 10 MG tablet Take 10 mg by mouth daily.    [provider]  lisinopril-hydrochlorothiazide (PRINZIDE,ZESTORETIC) 20-12.5 MG tablet Take 1 tablet by mouth daily. 01/25/18   [provider]  omeprazole (PRILOSEC) 20 MG capsule Take 1 capsule by mouth daily. 01/13/15   [provider]  ondansetron (ZOFRAN ODT) 4 MG disintegrating tablet Take 1 tablet (4 mg total) by mouth every 8 (eight) hours as needed for nausea or vomiting. 01/29/18   Signa Kell, MD  sildenafil (VIAGRA) 100 MG tablet Take 100 mg by mouth daily as needed for erectile dysfunction.    [provider]  traMADol (ULTRAM) 50 MG tablet Take 1 tablet (50 mg total) by mouth every 6 (six) hours as needed. 11/19/17   Cuthriell, Delorise Royals, PA-C    No Known Allergies  Family History  Problem Relation Age of Onset   Cancer Father    Hypertension Father    Hypertension Mother    Diabetes Mother     Social History Social History   Tobacco Use   Smoking status: Every Day    Packs/day: 1.00    Years: 30.00    Pack years: 30.00    Types:  Cigarettes   Smokeless tobacco: Never  Vaping Use   Vaping Use: Never used  Substance Use Topics   Alcohol use: Yes    Alcohol/week: 6.0 standard drinks    Types: 6 Cans of beer per week   Drug use: Yes    Types: Marijuana    Comment: sometimes    Review of Systems Constitutional: Negative for fever. Cardiovascular: Negative for chest pain. Respiratory: Negative for shortness of breath. Gastrointestinal: Negative for abdominal pain, vomiting positive for rectal bleeding with bowel movements x1 month worse over the past several days. Genitourinary: Negative for urinary compaints Musculoskeletal: Negative  for musculoskeletal complaints Neurological: Negative for headache All other ROS negative  ____________________________________________   PHYSICAL EXAM:  VITAL SIGNS: ED Triage Vitals  Enc Vitals Group     BP 05/02/21 0728 (!) 138/105     Pulse Rate 05/02/21 0728 99     Resp 05/02/21 0728 20     Temp 05/02/21 0728 98.2 F (36.8 C)     Temp Source 05/02/21 0728 Oral     SpO2 05/02/21 0728 99 %     Weight 05/02/21 0726 145 lb (65.8 kg)     Height 05/02/21 0726 5\' 10"  (1.778 m)     Head Circumference --      Peak Flow --      Pain Score 05/02/21 0726 8     Pain Loc --      Pain Edu? --      Excl. in GC? --    Constitutional: Alert and oriented. Well appearing and in no distress. Eyes: Normal exam ENT      Head: Normocephalic and atraumatic.      Mouth/Throat: Mucous membranes are moist. Cardiovascular: Normal rate, regular rhythm. Respiratory: Normal respiratory effort without tachypnea nor retractions. Breath sounds are clear  Gastrointestinal: Soft and nontender. No distention.   Musculoskeletal: Nontender with normal range of motion in all extremities. Neurologic:  Normal speech and language. No gross focal neurologic deficits  Skin:  Skin is warm, dry and intact.  Psychiatric: Mood and affect are normal.   ____________________________________________    EKG  EKG viewed and interpreted by myself shows a sinus rhythm at 94 bpm with a narrow QRS, normal axis, normal intervals, no concerning ST changes occasional PVC.  ____________________________________________   INITIAL IMPRESSION / ASSESSMENT AND PLAN / ED COURSE  Pertinent labs & imaging results that were available during my care of the patient were reviewed by me and considered in my medical decision making (see chart for details).   Patient presents emergency department for intermittent rectal bleeding with a history of hemorrhoidal bleeding requiring blood transfusion in the past.  On rectal examination  patient does have small amount of gross blood guaiac positive but no obvious hemorrhoids.  Reassuringly patient's hemoglobin is normal 11.3.  LFTs are elevated however patient is chronic alcoholic.  Lipase is elevated to 189 patient does state mild to moderate upper abdominal pain.  Patient states rectal bleeding is increasing.  Given the patient's elevated lipase consistent with likely alcohol induced pancreatitis along with rectal bleeding with gross blood on rectal examination we will admit to the hospital service for further work-up and monitoring.  Patient agreeable to plan of care.  SIGIFREDO PIGNATO was evaluated in Emergency Department on 05/02/2021 for the symptoms described in the history of present illness. He was evaluated in the context of the global COVID-19 pandemic, which necessitated consideration that the patient might be at risk for infection  with the SARS-CoV-2 virus that causes COVID-19. Institutional protocols and algorithms that pertain to the evaluation of patients at risk for COVID-19 are in a state of rapid change based on information released by regulatory bodies including the CDC and federal and state organizations. These policies and algorithms were followed during the patient's care in the ED.  ____________________________________________   FINAL CLINICAL IMPRESSION(S) / ED DIAGNOSES  Rectal bleeding Acute pancreatitis   Minna Antis, MD 05/02/21 1449

## 2021-05-02 NOTE — Plan of Care (Signed)
Education started 

## 2021-05-02 NOTE — ED Triage Notes (Signed)
Pt reports rectal bleeding for several days. Pt reports the blood in his stool is bright red in color. Pt reports is also trying to stop drinking. Pt reports drinks 6-12 beers a day and has for the past 40 years worse over the last year. Pt reports last had a drink last pm.

## 2021-05-03 ENCOUNTER — Observation Stay: Payer: Medicare Other

## 2021-05-03 DIAGNOSIS — Z23 Encounter for immunization: Secondary | ICD-10-CM | POA: Diagnosis present

## 2021-05-03 DIAGNOSIS — K2270 Barrett's esophagus without dysplasia: Secondary | ICD-10-CM | POA: Diagnosis present

## 2021-05-03 DIAGNOSIS — I10 Essential (primary) hypertension: Secondary | ICD-10-CM | POA: Diagnosis present

## 2021-05-03 DIAGNOSIS — I69398 Other sequelae of cerebral infarction: Secondary | ICD-10-CM | POA: Diagnosis not present

## 2021-05-03 DIAGNOSIS — K760 Fatty (change of) liver, not elsewhere classified: Secondary | ICD-10-CM | POA: Diagnosis present

## 2021-05-03 DIAGNOSIS — Z833 Family history of diabetes mellitus: Secondary | ICD-10-CM | POA: Diagnosis not present

## 2021-05-03 DIAGNOSIS — H548 Legal blindness, as defined in USA: Secondary | ICD-10-CM | POA: Diagnosis present

## 2021-05-03 DIAGNOSIS — K7010 Alcoholic hepatitis without ascites: Secondary | ICD-10-CM | POA: Diagnosis present

## 2021-05-03 DIAGNOSIS — N529 Male erectile dysfunction, unspecified: Secondary | ICD-10-CM | POA: Diagnosis present

## 2021-05-03 DIAGNOSIS — K649 Unspecified hemorrhoids: Secondary | ICD-10-CM | POA: Diagnosis present

## 2021-05-03 DIAGNOSIS — K852 Alcohol induced acute pancreatitis without necrosis or infection: Secondary | ICD-10-CM | POA: Diagnosis present

## 2021-05-03 DIAGNOSIS — K859 Acute pancreatitis without necrosis or infection, unspecified: Secondary | ICD-10-CM | POA: Diagnosis present

## 2021-05-03 DIAGNOSIS — F17210 Nicotine dependence, cigarettes, uncomplicated: Secondary | ICD-10-CM | POA: Diagnosis present

## 2021-05-03 DIAGNOSIS — K625 Hemorrhage of anus and rectum: Secondary | ICD-10-CM | POA: Diagnosis present

## 2021-05-03 DIAGNOSIS — Z8249 Family history of ischemic heart disease and other diseases of the circulatory system: Secondary | ICD-10-CM | POA: Diagnosis not present

## 2021-05-03 DIAGNOSIS — K047 Periapical abscess without sinus: Secondary | ICD-10-CM | POA: Diagnosis present

## 2021-05-03 DIAGNOSIS — E785 Hyperlipidemia, unspecified: Secondary | ICD-10-CM | POA: Diagnosis present

## 2021-05-03 DIAGNOSIS — F10239 Alcohol dependence with withdrawal, unspecified: Secondary | ICD-10-CM | POA: Diagnosis not present

## 2021-05-03 DIAGNOSIS — R7989 Other specified abnormal findings of blood chemistry: Secondary | ICD-10-CM | POA: Diagnosis not present

## 2021-05-03 DIAGNOSIS — I739 Peripheral vascular disease, unspecified: Secondary | ICD-10-CM | POA: Diagnosis present

## 2021-05-03 DIAGNOSIS — K219 Gastro-esophageal reflux disease without esophagitis: Secondary | ICD-10-CM | POA: Diagnosis present

## 2021-05-03 DIAGNOSIS — Z20822 Contact with and (suspected) exposure to covid-19: Secondary | ICD-10-CM | POA: Diagnosis present

## 2021-05-03 DIAGNOSIS — F1010 Alcohol abuse, uncomplicated: Secondary | ICD-10-CM | POA: Diagnosis not present

## 2021-05-03 DIAGNOSIS — E871 Hypo-osmolality and hyponatremia: Secondary | ICD-10-CM | POA: Diagnosis present

## 2021-05-03 DIAGNOSIS — J449 Chronic obstructive pulmonary disease, unspecified: Secondary | ICD-10-CM | POA: Diagnosis present

## 2021-05-03 DIAGNOSIS — D62 Acute posthemorrhagic anemia: Secondary | ICD-10-CM | POA: Diagnosis present

## 2021-05-03 DIAGNOSIS — Z79891 Long term (current) use of opiate analgesic: Secondary | ICD-10-CM | POA: Diagnosis not present

## 2021-05-03 LAB — HEPATITIS PANEL, ACUTE
HCV Ab: NONREACTIVE
Hep A IgM: NONREACTIVE
Hep B C IgM: NONREACTIVE
Hepatitis B Surface Ag: NONREACTIVE

## 2021-05-03 LAB — FOLATE: Folate: 18.8 ng/mL (ref >5.9)

## 2021-05-03 LAB — BASIC METABOLIC PANEL
Anion gap: 14 (ref 5–15)
BUN: 5 mg/dL — ABNORMAL LOW (ref 6–20)
CO2: 25 mmol/L (ref 22–32)
Calcium: 8.5 mg/dL — ABNORMAL LOW (ref 8.9–10.3)
Chloride: 96 mmol/L — ABNORMAL LOW (ref 98–111)
Creatinine, Ser: 0.87 mg/dL (ref 0.61–1.24)
GFR, Estimated: >60 mL/min (ref >60)
Glucose, Bld: 96 mg/dL (ref 70–99)
Potassium: 3.5 mmol/L (ref 3.5–5.1)
Sodium: 135 mmol/L (ref 135–145)

## 2021-05-03 LAB — CBC
HCT: 30.4 % — ABNORMAL LOW (ref 39.0–52.0)
Hemoglobin: 10.2 g/dL — ABNORMAL LOW (ref 13.0–17.0)
MCH: 29.1 pg (ref 26.0–34.0)
MCHC: 33.6 g/dL (ref 30.0–36.0)
MCV: 86.6 fL (ref 80.0–100.0)
Platelets: 132 10*3/uL — ABNORMAL LOW (ref 150–400)
RBC: 3.51 MIL/uL — ABNORMAL LOW (ref 4.22–5.81)
RDW: 15.7 % — ABNORMAL HIGH (ref 11.5–15.5)
WBC: 3.2 10*3/uL — ABNORMAL LOW (ref 4.0–10.5)
nRBC: 0 % (ref 0.0–0.2)

## 2021-05-03 LAB — IRON AND TIBC
Iron: 48 ug/dL (ref 45–182)
Saturation Ratios: 14 % — ABNORMAL LOW (ref 17.9–39.5)
TIBC: 350 ug/dL (ref 250–450)
UIBC: 302 ug/dL

## 2021-05-03 LAB — VITAMIN B12: Vitamin B-12: 442 pg/mL (ref 180–914)

## 2021-05-03 LAB — GLUCOSE, CAPILLARY: Glucose-Capillary: 94 mg/dL (ref 70–99)

## 2021-05-03 LAB — FERRITIN: Ferritin: 52 ng/mL (ref 24–336)

## 2021-05-03 LAB — HIV ANTIBODY (ROUTINE TESTING W REFLEX): HIV Screen 4th Generation wRfx: NONREACTIVE

## 2021-05-03 LAB — AMMONIA: Ammonia: 18 umol/L (ref 9–35)

## 2021-05-03 MED ORDER — CHLORDIAZEPOXIDE HCL 25 MG PO CAPS
25.0000 mg | ORAL_CAPSULE | Freq: Three times a day (TID) | ORAL | Status: DC
  Administered 2021-05-03 – 2021-05-05 (×8): 25 mg via ORAL
  Filled 2021-05-03 (×8): qty 1

## 2021-05-03 MED ORDER — HYDROCORTISONE ACETATE 25 MG RE SUPP
25.0000 mg | Freq: Two times a day (BID) | RECTAL | Status: DC
Start: 2021-05-03 — End: 2021-05-09
  Administered 2021-05-03 – 2021-05-07 (×8): 25 mg via RECTAL
  Filled 2021-05-03 (×14): qty 1

## 2021-05-03 NOTE — Progress Notes (Signed)
[facility] Inpatient Follow-up/Progress Note   Patient ID: Ronald Watts is a 55 y.o. male.  Overnight Events / Subjective Findings Patient resting in bed comfortably with daughter and wife at bedside.  He notes his abdominal discomfort is present but improved.  His rectal bleeding has stopped.  Hemoglobin stable overnight considering IV fluid resuscitation.  He is tolerating p.o.  In discussion with nursing he is receiving Ativan per CIWA protocol.   Reviewed and updated on lab data, liver ultrasound and GI plan. No other acute GI complaints  Review of Systems  Constitutional:  Negative for activity change, appetite change, chills, fatigue, fever and unexpected weight change.  HENT:  Negative for trouble swallowing and voice change.   Respiratory:  Negative for shortness of breath.   Cardiovascular:  Negative for chest pain and palpitations.  Gastrointestinal:  Positive for abdominal pain (improving) and anal bleeding (stopped). Negative for abdominal distention, blood in stool, constipation, diarrhea, nausea and vomiting.  Musculoskeletal:  Negative for arthralgias and myalgias.  Skin:  Negative for color change and pallor.  Neurological:  Negative for dizziness, syncope and weakness.  Psychiatric/Behavioral:  Negative for confusion. The patient is not nervous/anxious.   All other systems reviewed and are negative.   Medications  Current Facility-Administered Medications:    0.9 %  sodium chloride infusion, , Intravenous, Continuous, Lorretta Harp, MD, Last Rate: 100 mL/hr at 05/03/21 0146, New Bag at 05/03/21 0146   albuterol (PROVENTIL) (2.5 MG/3ML) 0.083% nebulizer solution 3 mL, 3 mL, Inhalation, Q4H PRN, Lorretta Harp, MD   amoxicillin (AMOXIL) 250 MG/5ML suspension 875 mg, 875 mg, Oral, BID, Lorretta Harp, MD, 875 mg at 05/03/21 1029   chlordiazePOXIDE (LIBRIUM) capsule 25 mg, 25 mg, Oral, TID, Esaw Grandchild A, DO, 25 mg at 05/03/21 1028   cyclobenzaprine (FLEXERIL) tablet  10 mg, 10 mg, Oral, QHS, Lorretta Harp, MD, 10 mg at 05/02/21 2102   dextromethorphan-guaiFENesin (MUCINEX DM) 30-600 MG per 12 hr tablet 1 tablet, 1 tablet, Oral, BID PRN, Lorretta Harp, MD   gabapentin (NEURONTIN) capsule 100 mg, 100 mg, Oral, TID, Lorretta Harp, MD, 100 mg at 05/03/21 1028   gabapentin (NEURONTIN) tablet 600 mg, 600 mg, Oral, TID, Lorretta Harp, MD, 600 mg at 05/03/21 1028   hydrALAZINE (APRESOLINE) injection 5 mg, 5 mg, Intravenous, Q2H PRN, Lorretta Harp, MD   hydrochlorothiazide (HYDRODIURIL) tablet 12.5 mg, 12.5 mg, Oral, Daily, Lowella Bandy, RPH, 12.5 mg at 05/03/21 1027   hydrocortisone (ANUSOL-HC) suppository 25 mg, 25 mg, Rectal, BID, [name], DO   influenza vac split quadrivalent PF (FLUARIX) injection 0.5 mL, 0.5 mL, Intramuscular, Tomorrow-1000, Lorretta Harp, MD   lisinopril (ZESTRIL) tablet 20 mg, 20 mg, Oral, Daily, Lowella Bandy, RPH, 20 mg at 05/03/21 1028   LORazepam (ATIVAN) injection 0-4 mg, 0-4 mg, Intravenous, Q6H **OR** LORazepam (ATIVAN) tablet 0-4 mg, 0-4 mg, Oral, Q6H, Lorretta Harp, MD, 2 mg at 05/03/21 1027   [START ON 05/04/2021] LORazepam (ATIVAN) injection 0-4 mg, 0-4 mg, Intravenous, Q12H **OR** [START ON 05/04/2021] LORazepam (ATIVAN) tablet 0-4 mg, 0-4 mg, Oral, Q12H, Lorretta Harp, MD   magnesium oxide (MAG-OX) tablet 400 mg, 400 mg, Oral, BID, Lorretta Harp, MD, 400 mg at 05/03/21 1028   morphine 2 MG/ML injection 2 mg, 2 mg, Intravenous, Q4H PRN, Lorretta Harp, MD   nicotine (NICODERM CQ - dosed in mg/24 hours) patch 21 mg, 21 mg, Transdermal, Daily, Lorretta Harp, MD, 21 mg at 05/03/21 1029   ondansetron (ZOFRAN) injection 4 mg,  4 mg, Intravenous, Q8H PRN, Lorretta Harp, MD   pantoprazole (PROTONIX) injection 40 mg, 40 mg, Intravenous, Q12H, Lorretta Harp, MD, 40 mg at 05/03/21 1029   thiamine tablet 100 mg, 100 mg, Oral, Daily, 100 mg at 05/03/21 1028 **OR** thiamine (B-1) injection 100 mg, 100 mg, Intravenous, Daily, Lorretta Harp, MD  sodium chloride 100 mL/hr at  05/03/21 0146    albuterol, dextromethorphan-guaiFENesin, hydrALAZINE, morphine injection, ondansetron (ZOFRAN) IV   Objective    Vitals:   05/03/21 0011 05/03/21 0518 05/03/21 0812 05/03/21 1210  BP: (!) 117/98 (!) 157/105 (!) 156/108 (!) 136/112  Pulse: 74 74 81 74  Resp: 18 18 16 17   Temp: 99 F (37.2 C) 98.7 F (37.1 C) 98.2 F (36.8 C) 98.1 F (36.7 C)  TempSrc: Oral Oral Oral Oral  SpO2: 99% 97% 99% 100%  Weight:      Height:         Physical Exam Vitals and nursing note reviewed.  Constitutional:      General: He is not in acute distress.    Appearance: Normal appearance. He is not ill-appearing, toxic-appearing or diaphoretic.  HENT:     Head: Normocephalic and atraumatic.     Nose: Nose normal.     Mouth/Throat:     Mouth: Mucous membranes are moist.     Pharynx: Oropharynx is clear.  Eyes:     General: No scleral icterus.    Extraocular Movements: Extraocular movements intact.  Cardiovascular:     Rate and Rhythm: Normal rate and regular rhythm.     Heart sounds: Normal heart sounds. No murmur heard.   No friction rub. No gallop.  Pulmonary:     Effort: Pulmonary effort is normal. No respiratory distress.     Breath sounds: Normal breath sounds. No wheezing, rhonchi or rales.  Abdominal:     General: Abdomen is flat. Bowel sounds are normal. There is no distension.     Palpations: Abdomen is soft.     Tenderness: There is no abdominal tenderness. There is no guarding or rebound.  Musculoskeletal:     Cervical back: Neck supple.     Right lower leg: No edema.     Left lower leg: No edema.  Skin:    General: Skin is warm and dry.     Coloration: Skin is not jaundiced or pale.  Neurological:     General: No focal deficit present.     Mental Status: He is alert and oriented to person, place, and time. Mental status is at baseline.  Psychiatric:        Mood and Affect: Mood normal.        Behavior: Behavior normal.        Thought Content: Thought  content normal.        Judgment: Judgment normal.     Laboratory Data Recent Labs  Lab 05/02/21 0728 05/02/21 1409 05/03/21 0447  WBC 4.9 4.6 3.2*  HGB 11.3* 11.3* 10.2*  HCT 33.9* 32.9* 30.4*  PLT 158 153 132*   Recent Labs  Lab 05/02/21 0728 05/03/21 0447  NA 131* 135  K 3.6 3.5  CL 92* 96*  CO2 26 25  BUN 6 5*  CREATININE 0.85 0.87  CALCIUM 9.4 8.5*  PROT 7.6  --   BILITOT 0.8  --   ALKPHOS 110  --   ALT 79*  --   AST 182*  --   GLUCOSE 137* 96   Recent Labs  Lab 05/02/21 1409  INR 0.9      Imaging Studies: US Abdomen Complete  Result Date: 05/03/2021 CLINICAL DATA:  Alcohol abuse, acute alcoholic pancreatitis with elevated LFTs. EXAM: ABDOMEN ULTRASOUND COMPLETE COMPARISON:  None. FINDINGS: Gallbladder: No gallstones or wall thickening visualized. No sonographic Murphy sign noted by sonographer. Common bile duct: Diameter: Normal at 4 mm Liver: Increased parenchymal echogenicity with slight coarsening of the echotexture. The adjacent right renal parenchyma appears hypoechoic in comparison. Portal vein is patent on color Doppler imaging with normal direction of blood flow towards the liver. IVC: No abnormality visualized. Pancreas: Pancreatic parenchyma is relatively hypoechoic suggesting edema. Spleen: Size and appearance within normal limits. Right Kidney: Length: 10.7 cm. Echogenicity within normal limits. No mass or hydronephrosis visualized. Left Kidney: Length: 12.3 cm. Echogenicity within normal limits. No mass or hydronephrosis visualized. Abdominal aorta: No aneurysm visualized. Other findings: None. IMPRESSION: 1. Relative hypoechogenicity of the pancreatic parenchyma suggests pancreatic edema which would be consistent with the clinical history of pancreatitis. No peripancreatic fluid or pseudocyst identified. 2. Hepatic steatosis. Electronically Signed   By: Malachy Moan M.D.   On: 05/03/2021 10:07   US LIVER DOPPLER  Result Date:  05/03/2021 CLINICAL DATA:  55 year old male with elevated LFTs EXAM: DUPLEX ULTRASOUND OF LIVER TECHNIQUE: Color and duplex Doppler ultrasound was performed to evaluate the hepatic in-flow and out-flow vessels. COMPARISON:  Concurrently obtained but separately dictated abdominal ultrasound FINDINGS: Liver: Slightly increased parenchymal echogenicity with coarsening of the echotexture. The adjacent renal parenchyma appears hypoechoic in comparison. No discrete lesion. Normal hepatic contour without nodularity. No focal lesion, mass or intrahepatic biliary ductal dilatation. Main Portal Vein size: 1.3 cm Portal Vein Velocities Main Prox:  22 cm/sec Main Mid: 23 cm/sec Main Dist:  28 cm/sec Right: 15 cm/sec Left: 22 cm/sec Hepatic Vein Velocities Right:  20 cm/sec Middle:  23 cm/sec Left:  79 cm/sec IVC: Present and patent with normal respiratory phasicity. Hepatic Artery Velocity:  93 cm/sec Splenic Vein Velocity:  35 cm/sec Spleen: 4.3 cm x 8.9 cm x 9.5 cm with a total volume of 188 cm^3 (411 cm^3 is upper limit normal) Portal Vein Occlusion/Thrombus: No Splenic Vein Occlusion/Thrombus: No Ascites: None Varices: None IMPRESSION: 1. Widely patent portal veins with normal hepatopetal directional flow. 2. Normally patent hepatic veins. 3. Echogenic and coarsened hepatic parenchyma suggests underlying steatosis. Electronically Signed   By: Malachy Moan M.D.   On: 05/03/2021 10:05    Assessment:   # Bright red blood per rectum- suspect hemorrhoidal vs diverticular; resolved - chronic; worsened prior to presentation - noted by EM provider on rectal exam   # Alcoholic pancreatitis - epigastric pain with lipase >3x ULN of test - US demonstrating pancreas inflammation/edema   #Alcoholic Hepatitis - AST ALT >2:1 ratio - viral hepatitis negative - fatty liver disease on liver US and normal doppler - Mdf <32   # EtOH Abuse with withdrawal - requiring ativan per ciwa   # Tobacco dependence   # Chronic  anemia; normocytic - was 9.7 in 2019; now 11.3 on presentation; mcv 84.3   # Hyponatremia- likely 2/2 etoh abuse   # Barretts Esophagus- dx 2006 per chart review   # chronic comorbidities: HTN, HLD, h/o stroke, COPD, GERD Plan:  Hgb stable, understandable global decrease in cbc due to ivf resuscitation Tolerating po Rectal bleeding has stopped Provide anusol suppositories bid Labs, imaging, GI care plan reviewed with patient and family in room Agreeable to outpatient egd and colonoscopy. Office information provided to wife at  bedside to call and schedule appointment. Monitor H&H; transfusion and resuscitation as per primary team. Caution with lab frequency to prevent lab induced anemia No role in trending lipase or checking ammonia Maddrey Df <32- no role for steroids Continue home ppi Recommend starting po iron qod  CIWA protocol for etoh withdrawal per primary team Supportive care including anti emetics and pain control as per primary team Can dc ivf for acute pancreatitis as patient now eating and >24 hours High protein diet Counseled on etoh and tobacco cessation  GI to sign off. Available as needed. Please do not hesitate to call regarding questions or concerns.   I personally performed the service.  Management of other medical comorbidities as per primary team  Thank you for allowing [place] to participate in this patient's care. Please don't hesitate to call if any questions or concerns arise.   [name], DO [facility]  Portions of the record may have been created with voice recognition software. Occasional wrong-word or 'sound-a-like' substitutions may have occurred due to the inherent limitations of voice recognition software.  Read the chart carefully and recognize, using context, where substitutions may have occurred.

## 2021-05-03 NOTE — TOC Initial Note (Signed)
Transition of Care [facility]) - Initial/Assessment Note    Patient Details  Name: Ronald Watts MRN: [id number] Date of Birth: 02-Dec-1965  [facility] CM/SW Contact:    Shelbie Hutching, RN Phone Number: 05/03/2021, 2:11 PM  Clinical Narrative:                 Patient admitted to the hospital with rectal bleeding, and alcohol induced pancreatitis.  RNCM met with patient at the bedside.  Wife and daughter are in the room with patient.  Patient is from home with his wife, he is independent at home but on disability.  Wife provides transportation.  Patient is current with [facility] and gets prescriptions from them.   RNCM did provide patient with substance abuse resources if he decides he is interested in outpatient treatment at discharge.    Expected Discharge Plan: Home/Self Care Barriers to Discharge: Continued Medical Work up   Patient Goals and CMS Choice Patient states their goals for this hospitalization and ongoing recovery are:: feel better and get back home      Expected Discharge Plan and Services Expected Discharge Plan: Home/Self Care   Discharge Planning Services: CM Consult   Living arrangements for the past 2 months: Single Family Home                 DME Arranged: N/A DME Agency: NA       HH Arranged: NA HH Agency: NA        Prior Living Arrangements/Services Living arrangements for the past 2 months: Single Family Home Lives with:: Spouse Patient language and need for interpreter reviewed:: Yes Do you feel safe going back to the place where you live?: Yes      Need for Family Participation in Patient Care: Yes (Comment) (pancreatitis) Care giver support system in place?: Yes (comment) (wife and daughter)   Criminal Activity/Legal Involvement Pertinent to Current Situation/Hospitalization: No - Comment as needed  Activities of Daily Living Home Assistive Devices/Equipment: None ADL Screening (condition at time of admission) Patient's  cognitive ability adequate to safely complete daily activities?: Yes Is the patient deaf or have difficulty hearing?: No Does the patient have difficulty seeing, even when wearing glasses/contacts?: Yes Does the patient have difficulty concentrating, remembering, or making decisions?: No Patient able to express need for assistance with ADLs?: Yes Does the patient have difficulty dressing or bathing?: No Independently performs ADLs?: Yes (appropriate for developmental age) Does the patient have difficulty walking or climbing stairs?: No Weakness of Legs: None Weakness of Arms/Hands: None  Permission Sought/Granted Permission sought to share information with : Case Manager, Family Supports Permission granted to share information with : Yes, Verbal Permission Granted  Share Information with NAME: Jarold Motto     Permission granted to share info w Relationship: wife  Permission granted to share info w Contact Information: [phone]  Emotional Assessment Appearance:: Appears older than stated age Attitude/Demeanor/Rapport: Engaged Affect (typically observed): Accepting Orientation: : Oriented to Self, Oriented to Place, Oriented to  Time, Oriented to Situation Alcohol / Substance Use: Alcohol Use, Tobacco Use Psych Involvement: No (comment)  Admission diagnosis:  Rectal bleeding [K62.5] Acute pancreatitis [K85.90] Patient Active Problem List   Diagnosis Date Noted   Acute pancreatitis 05/03/2021   Rectal bleeding 05/02/2021   Dental infection 05/02/2021   Hypertension    HLD (hyperlipidemia)    GERD (gastroesophageal reflux disease)    COPD (chronic obstructive pulmonary disease) (HCC)    Anemia due to blood loss  Stroke [facility])    Alcohol abuse    Tobacco abuse    Alcoholic pancreatitis    Abnormal LFTs    Vasectomy evaluation 02/06/2015   PCP:  [name], MD Pharmacy:   [id number] [place] [place] [address] [place] [place] [address]Phone: [phone]: [phone]    Social Determinants of Health (SDOH) Interventions    Readmission Risk Interventions No flowsheet data found.

## 2021-05-03 NOTE — Progress Notes (Signed)
PROGRESS NOTE    Ronald Watts   OYD:[id number]  DOB: 1966/03/31  PCP: Leanna Sato, MD    DOA: 05/02/2021 LOS: 0    Brief Narrative / Hospital Course to Date:   Ronald Watts is a 55 y.o. male with medical history significant of alcohol abuse, tobacco abuse, hypertension, hyperlipidemia, COPD, stroke, GERD, anemia, sciatica, blood transfusion due to GI bleeding, who presents with rectal bleeding.  He also reported nausea, vomiting and abdominal pain.    In the ED, labs showed Hbg 11.3, elevated lipase 189, and elevated LFT's.  Hemodynamically stable.  Admitted to [facility] service with GI consulted for further evaluation and management of GI bleeding and acute pancreatitis.  Assessment & Plan   Principal Problem:   Rectal bleeding Active Problems:   Hypertension   HLD (hyperlipidemia)   GERD (gastroesophageal reflux disease)   COPD (chronic obstructive pulmonary disease) (HCC)   Anemia due to blood loss   Stroke [facility])   Alcohol abuse   Tobacco abuse   Alcoholic pancreatitis   Abnormal LFTs   Dental infection   Acute pancreatitis   Rectal bleeding with mild blood loss anemia -likely due to hemorrhoids.  Hemoglobin on admission 11.3.  Hemodynamically stable without actively bleeding. Anemia panel unremarkable, only mildly reduced sat ratio. Showed normal iron, ferritin, TIBC, vitamin B12 and folate. 10/24: Patient denies bleeding since admission --GI consulted --Recommend outpatient EGD and colonoscopy --Trend hemoglobin, transfuse if less than 7 --Avoid NSAIDs and anticoagulants --IV PPI twice daily, discharged on oral daily PPI --Anusol suppositories twice daily  Acute pancreatitis -likely due to alcohol abuse. Abdominal pain, nausea vomiting have improved. --Advance diet --IV fluids until tolerating adequate p.o. intake --Antiemetics and pain control as needed  Alcohol withdrawal syndrome, uncomplicated -patient expresses desire to detox from alcohol.  He has mild  tremors. --Scheduled Librium --CIWA protocol with as needed Ativan --Recommend maintenance medication in the outpatient setting --TOC consulted to provide local resources for assistance  Transaminitis -most likely due to alcohol abuse. Acute hepatitis panel and HIV screen negative.  Abdominal ultrasound showed:  1. Relative hypoechogenicity of the pancreatic parenchyma suggests pancreatic edema which would be consistent with the clinical history of pancreatitis. No peripancreatic fluid or pseudocyst identified. 2. Hepatic steatosis  Liver Doppler ultrasound showed: 1. Widely patent portal veins with normal hepatopetal directional flow. 2. Normally patent hepatic veins. 3. Echogenic and coarsened hepatic parenchyma suggests underlying steatosis  Dental infection -no fever or leukocytosis.  No signs of systemic infection or sepsis. --Continue amoxicillin prescribed as outpatient  Hypertension -continue home medication and as needed hydralazine  GERD -currently on IV PPI, transition back to home oral tomorrow if tolerating p.o. intake well  COPD -stable without signs of acute exacerbation. --Bronchodilators  History of stroke -not currently on home medications.  No acute issues or neurologic symptoms. --PCP follow-up  Patient BMI: Body mass index is 20.81 kg/m.   DVT prophylaxis: SCDs Start: 05/02/21 1438   Diet:  Diet Orders (From admission, onward)     Start     Ordered   05/03/21 1019  Diet regular Room service appropriate? Yes; Fluid consistency: Thin  Diet effective now       Question Answer Comment  Room service appropriate? Yes   Fluid consistency: Thin      05/03/21 1018              Code Status: Full Code   Subjective 05/03/21    Patient seen after having ultrasound done  today.  He has mild tremor in the hands but otherwise feeling okay right now.  He reports increased swelling around his hemorrhoids and more difficulty reducing them after having bowel  movement today.  No other acute complaints.   Disposition Plan & Communication   Status is: Inpatient  Remains inpatient appropriate because: IV therapies and ongoing alcohol withdrawal with desire to detox.    Family Communication: Wife and daughter at bedside on rounds   Consults, Procedures, Significant Events   Consultants:  Gastroenterology  Procedures:  None  Antimicrobials:  Anti-infectives (From admission, onward)    Start     Dose/Rate Route Frequency Ordered Stop   05/02/21 2000  amoxicillin (AMOXIL) 250 MG/5ML suspension 875 mg        875 mg Oral 2 times daily 05/02/21 1439 05/08/21 2359         Micro    Objective   Vitals:   05/03/21 0011 05/03/21 0518 05/03/21 0812 05/03/21 1210  BP: (!) 117/98 (!) 157/105 (!) 156/108 (!) 136/112  Pulse: 74 74 81 74  Resp: 18 18 16 17   Temp: 99 F (37.2 C) 98.7 F (37.1 C) 98.2 F (36.8 C) 98.1 F (36.7 C)  TempSrc: Oral Oral Oral Oral  SpO2: 99% 97% 99% 100%  Weight:      Height:        Intake/Output Summary (Last 24 hours) at 05/03/2021 1557 Last data filed at 05/03/2021 1411 Gross per 24 hour  Intake 1833.73 ml  Output 800 ml  Net 1033.73 ml   Filed Weights   05/02/21 0726  Weight: 65.8 kg    Physical Exam:  General exam: awake, alert, no acute distress Respiratory system: CTAB, no wheezes, rales or rhonchi, normal respiratory effort. Cardiovascular system: normal S1/S2, RRR, no JVD, murmurs, rubs, gallops, no pedal edema.   Gastrointestinal system: soft, mild epigastric tenderness on palpation, nondistended Central nervous system: A&O x3. no gross focal neurologic deficits, normal speech Extremities: Mild tremor of outstretched hands bilaterally, no edema, normal tone Skin: dry, intact, normal temperature Psychiatry: normal mood, congruent affect, judgement and insight appear normal  Labs   Data Reviewed: I have personally reviewed following labs and imaging studies  CBC: Recent Labs   Lab 05/02/21 0728 05/02/21 1409 05/03/21 0447  WBC 4.9 4.6 3.2*  HGB 11.3* 11.3* 10.2*  HCT 33.9* 32.9* 30.4*  MCV 84.3 85.9 86.6  PLT 158 153 132*   Basic Metabolic Panel: Recent Labs  Lab 05/02/21 0728 05/02/21 2046 05/03/21 0447  NA 131*  --  135  K 3.6  --  3.5  CL 92*  --  96*  CO2 26  --  25  GLUCOSE 137*  --  96  BUN 6  --  5*  CREATININE 0.85  --  0.87  CALCIUM 9.4  --  8.5*  MG  --  1.8  --    GFR: Estimated Creatinine Clearance: 90.3 mL/min (by C-G formula based on SCr of 0.87 mg/dL). Liver Function Tests: Recent Labs  Lab 05/02/21 0728  AST 182*  ALT 79*  ALKPHOS 110  BILITOT 0.8  PROT 7.6  ALBUMIN 3.8   Recent Labs  Lab 05/02/21 0728  LIPASE 189*   Recent Labs  Lab 05/03/21 1025  AMMONIA 18   Coagulation Profile: Recent Labs  Lab 05/02/21 1409  INR 0.9   Cardiac Enzymes: No results for input(s): CKTOTAL, CKMB, CKMBINDEX, TROPONINI in the last 168 hours. BNP (last 3 results) No results for input(s): PROBNP  in the last 8760 hours. HbA1C: No results for input(s): HGBA1C in the last 72 hours. CBG: Recent Labs  Lab 05/03/21 0814  GLUCAP 94   Lipid Profile: Recent Labs    05/02/21 1201  TRIG 52   Thyroid Function Tests: No results for input(s): TSH, T4TOTAL, FREET4, T3FREE, THYROIDAB in the last 72 hours. Anemia Panel: Recent Labs    05/02/21 1409 05/03/21 0447  VITAMINB12 458 442  FOLATE  --  18.8  FERRITIN  --  52  TIBC  --  350  IRON  --  48   Sepsis Labs: No results for input(s): PROCALCITON, LATICACIDVEN in the last 168 hours.  Recent Results (from the past 240 hour(s))  Resp Panel by RT-PCR (Flu A&B, Covid) Nasopharyngeal Swab     Status: None   Collection Time: 05/02/21 10:13 AM   Specimen: Nasopharyngeal Swab; Nasopharyngeal(NP) swabs in vial transport medium  Result Value Ref Range Status   SARS Coronavirus 2 by RT PCR NEGATIVE NEGATIVE Final    Comment: (NOTE) SARS-CoV-2 target nucleic acids are NOT  DETECTED.  The SARS-CoV-2 RNA is generally detectable in upper respiratory specimens during the acute phase of infection. The lowest concentration of SARS-CoV-2 viral copies this assay can detect is 138 copies/mL. A negative result does not preclude SARS-Cov-2 infection and should not be used as the sole basis for treatment or other patient management decisions. A negative result may occur with  improper specimen collection/handling, submission of specimen other than nasopharyngeal swab, presence of viral mutation(s) within the areas targeted by this assay, and inadequate number of viral copies(<138 copies/mL). A negative result must be combined with clinical observations, patient history, and epidemiological information. The expected result is Negative.  Fact Sheet for Patients:  BloggerCourse.com  Fact Sheet for Healthcare Providers:  SeriousBroker.it  This test is no t yet approved or cleared by the [place] FDA and  has been authorized for detection and/or diagnosis of SARS-CoV-2 by FDA under an Emergency Use Authorization (EUA). This EUA will remain  in effect (meaning this test can be used) for the duration of the COVID-19 declaration under Section 564(b)(1) of the Act, 21 U.S.C.section 360bbb-3(b)(1), unless the authorization is terminated  or revoked sooner.       Influenza A by PCR NEGATIVE NEGATIVE Final   Influenza B by PCR NEGATIVE NEGATIVE Final    Comment: (NOTE) The Xpert Xpress SARS-CoV-2/FLU/RSV plus assay is intended as an aid in the diagnosis of influenza from Nasopharyngeal swab specimens and should not be used as a sole basis for treatment. Nasal washings and aspirates are unacceptable for Xpert Xpress SARS-CoV-2/FLU/RSV testing.  Fact Sheet for Patients: BloggerCourse.com  Fact Sheet for Healthcare Providers: SeriousBroker.it  This test is not yet  approved or cleared by the [place] FDA and has been authorized for detection and/or diagnosis of SARS-CoV-2 by FDA under an Emergency Use Authorization (EUA). This EUA will remain in effect (meaning this test can be used) for the duration of the COVID-19 declaration under Section 564(b)(1) of the Act, 21 U.S.C. section 360bbb-3(b)(1), unless the authorization is terminated or revoked.  Performed at [facility], [address] [place], [place] [address]       Imaging Studies   US Abdomen Complete  Result Date: 05/03/2021 CLINICAL DATA:  Alcohol abuse, acute alcoholic pancreatitis with elevated LFTs. EXAM: ABDOMEN ULTRASOUND COMPLETE COMPARISON:  None. FINDINGS: Gallbladder: No gallstones or wall thickening visualized. No sonographic Murphy sign noted by sonographer. Common bile duct: Diameter: Normal  at 4 mm Liver: Increased parenchymal echogenicity with slight coarsening of the echotexture. The adjacent right renal parenchyma appears hypoechoic in comparison. Portal vein is patent on color Doppler imaging with normal direction of blood flow towards the liver. IVC: No abnormality visualized. Pancreas: Pancreatic parenchyma is relatively hypoechoic suggesting edema. Spleen: Size and appearance within normal limits. Right Kidney: Length: 10.7 cm. Echogenicity within normal limits. No mass or hydronephrosis visualized. Left Kidney: Length: 12.3 cm. Echogenicity within normal limits. No mass or hydronephrosis visualized. Abdominal aorta: No aneurysm visualized. Other findings: None. IMPRESSION: 1. Relative hypoechogenicity of the pancreatic parenchyma suggests pancreatic edema which would be consistent with the clinical history of pancreatitis. No peripancreatic fluid or pseudocyst identified. 2. Hepatic steatosis. Electronically Signed   By: Malachy Moan M.D.   On: 05/03/2021 10:07   US LIVER DOPPLER  Result Date: 05/03/2021 CLINICAL DATA:  55 year old male with elevated  LFTs EXAM: DUPLEX ULTRASOUND OF LIVER TECHNIQUE: Color and duplex Doppler ultrasound was performed to evaluate the hepatic in-flow and out-flow vessels. COMPARISON:  Concurrently obtained but separately dictated abdominal ultrasound FINDINGS: Liver: Slightly increased parenchymal echogenicity with coarsening of the echotexture. The adjacent renal parenchyma appears hypoechoic in comparison. No discrete lesion. Normal hepatic contour without nodularity. No focal lesion, mass or intrahepatic biliary ductal dilatation. Main Portal Vein size: 1.3 cm Portal Vein Velocities Main Prox:  22 cm/sec Main Mid: 23 cm/sec Main Dist:  28 cm/sec Right: 15 cm/sec Left: 22 cm/sec Hepatic Vein Velocities Right:  20 cm/sec Middle:  23 cm/sec Left:  79 cm/sec IVC: Present and patent with normal respiratory phasicity. Hepatic Artery Velocity:  93 cm/sec Splenic Vein Velocity:  35 cm/sec Spleen: 4.3 cm x 8.9 cm x 9.5 cm with a total volume of 188 cm^3 (411 cm^3 is upper limit normal) Portal Vein Occlusion/Thrombus: No Splenic Vein Occlusion/Thrombus: No Ascites: None Varices: None IMPRESSION: 1. Widely patent portal veins with normal hepatopetal directional flow. 2. Normally patent hepatic veins. 3. Echogenic and coarsened hepatic parenchyma suggests underlying steatosis. Electronically Signed   By: Malachy Moan M.D.   On: 05/03/2021 10:05     Medications   Scheduled Meds:  amoxicillin  875 mg Oral BID   chlordiazePOXIDE  25 mg Oral TID   cyclobenzaprine  10 mg Oral QHS   gabapentin  100 mg Oral TID   gabapentin  600 mg Oral TID   hydrochlorothiazide  12.5 mg Oral Daily   hydrocortisone  25 mg Rectal BID   influenza vac split quadrivalent PF  0.5 mL Intramuscular Tomorrow-1000   lisinopril  20 mg Oral Daily   LORazepam  0-4 mg Intravenous Q6H   Or   LORazepam  0-4 mg Oral Q6H   [START ON 05/04/2021] LORazepam  0-4 mg Intravenous Q12H   Or   [START ON 05/04/2021] LORazepam  0-4 mg Oral Q12H   magnesium oxide  400  mg Oral BID   nicotine  21 mg Transdermal Daily   pantoprazole (PROTONIX) IV  40 mg Intravenous Q12H   thiamine  100 mg Oral Daily   Or   thiamine  100 mg Intravenous Daily   Continuous Infusions:  sodium chloride 100 mL/hr at 05/03/21 0146       LOS: 0 days    Time spent: 30 minutes    Pennie Banter, DO [facility]  05/03/2021, 3:57 PM      If 7PM-7AM, please contact night-coverage. How to contact the [facility] Attending or Consulting provider 7A - 7P or covering provider  during after hours 7P -7A, for this patient?    Check the care team in [facility] and look for a) attending/consulting [facility] provider listed and b) the [facility] team listed Log into [email or web address] and use [place]'s universal password to access. If you do not have the password, please contact the hospital operator. Locate the [facility] provider you are looking for under [facility] and page to a number that you can be directly reached. If you still have difficulty reaching the provider, please page the [facility] (Director on Call) for the Hospitalists listed on amion for assistance.

## 2021-05-03 NOTE — Care Management Obs Status (Signed)
MEDICARE OBSERVATION STATUS NOTIFICATION   Patient Details  Name: MARTAVIUS LUSTY MRN: [id number] Date of Birth: [birth year]   Medicare Observation Status Notification Given:  Yes    Allayne Butcher, RN 05/03/2021, 12:28 PM

## 2021-05-04 DIAGNOSIS — K625 Hemorrhage of anus and rectum: Secondary | ICD-10-CM | POA: Diagnosis not present

## 2021-05-04 LAB — COMPREHENSIVE METABOLIC PANEL
ALT: 52 U/L — ABNORMAL HIGH (ref 0–44)
AST: 79 U/L — ABNORMAL HIGH (ref 15–41)
Albumin: 3.3 g/dL — ABNORMAL LOW (ref 3.5–5.0)
Alkaline Phosphatase: 90 U/L (ref 38–126)
Anion gap: 7 (ref 5–15)
BUN: 5 mg/dL — ABNORMAL LOW (ref 6–20)
CO2: 24 mmol/L (ref 22–32)
Calcium: 9 mg/dL (ref 8.9–10.3)
Chloride: 106 mmol/L (ref 98–111)
Creatinine, Ser: 0.83 mg/dL (ref 0.61–1.24)
GFR, Estimated: >60 mL/min (ref >60)
Glucose, Bld: 103 mg/dL — ABNORMAL HIGH (ref 70–99)
Potassium: 3.5 mmol/L (ref 3.5–5.1)
Sodium: 137 mmol/L (ref 135–145)
Total Bilirubin: 0.7 mg/dL (ref 0.3–1.2)
Total Protein: 6.4 g/dL — ABNORMAL LOW (ref 6.5–8.1)

## 2021-05-04 LAB — CBC
HCT: 29.2 % — ABNORMAL LOW (ref 39.0–52.0)
Hemoglobin: 9.7 g/dL — ABNORMAL LOW (ref 13.0–17.0)
MCH: 29.4 pg (ref 26.0–34.0)
MCHC: 33.2 g/dL (ref 30.0–36.0)
MCV: 88.5 fL (ref 80.0–100.0)
Platelets: 138 10*3/uL — ABNORMAL LOW (ref 150–400)
RBC: 3.3 MIL/uL — ABNORMAL LOW (ref 4.22–5.81)
RDW: 15.9 % — ABNORMAL HIGH (ref 11.5–15.5)
WBC: 3.8 10*3/uL — ABNORMAL LOW (ref 4.0–10.5)
nRBC: 0 % (ref 0.0–0.2)

## 2021-05-04 LAB — GLUCOSE, CAPILLARY
Glucose-Capillary: 109 mg/dL — ABNORMAL HIGH (ref 70–99)
Glucose-Capillary: 95 mg/dL (ref 70–99)
Glucose-Capillary: 107 mg/dL — ABNORMAL HIGH (ref 70–99)

## 2021-05-04 LAB — MAGNESIUM: Magnesium: 1.8 mg/dL (ref 1.7–2.4)

## 2021-05-04 MED ORDER — LORAZEPAM 2 MG PO TABS
0.0000 mg | ORAL_TABLET | ORAL | Status: DC
  Administered 2021-05-04 – 2021-05-05 (×4): 2 mg via ORAL
  Filled 2021-05-04 (×4): qty 1

## 2021-05-04 MED ORDER — LORAZEPAM 2 MG PO TABS: 0.0000 mg | ORAL_TABLET | ORAL | Status: DC | PRN

## 2021-05-04 MED ORDER — LORAZEPAM 2 MG/ML IJ SOLN: 0.0000 mg | INTRAMUSCULAR | Status: DC | PRN

## 2021-05-04 MED ORDER — PANTOPRAZOLE SODIUM 40 MG PO TBEC
40.0000 mg | DELAYED_RELEASE_TABLET | Freq: Two times a day (BID) | ORAL | Status: DC
  Administered 2021-05-04 – 2021-05-09 (×11): 40 mg via ORAL
  Filled 2021-05-04 (×11): qty 1

## 2021-05-04 MED ORDER — LORAZEPAM 2 MG/ML IJ SOLN: 0.0000 mg | INTRAMUSCULAR | Status: DC

## 2021-05-04 MED ORDER — FERROUS SULFATE 325 (65 FE) MG PO TABS
325.0000 mg | ORAL_TABLET | ORAL | Status: DC
  Administered 2021-05-04 – 2021-05-08 (×3): 325 mg via ORAL
  Filled 2021-05-04 (×6): qty 1

## 2021-05-04 MED ORDER — MORPHINE SULFATE (PF) 2 MG/ML IV SOLN: 2.0000 mg | INTRAVENOUS | Status: DC | PRN

## 2021-05-04 MED ORDER — HYDROCODONE-ACETAMINOPHEN 5-325 MG PO TABS
1.0000 | ORAL_TABLET | Freq: Four times a day (QID) | ORAL | Status: DC | PRN
  Administered 2021-05-05: 1 via ORAL
  Filled 2021-05-04: qty 1

## 2021-05-04 NOTE — Progress Notes (Addendum)
PROGRESS NOTE    Ronald Watts   YPP:[id number]  DOB: May 31, 1966  PCP: Ronald Sato, MD    DOA: 05/02/2021 LOS: 1    Brief Narrative / Hospital Course to Date:   Ronald Watts is a 55 y.o. male with medical history significant of alcohol abuse, tobacco abuse, hypertension, hyperlipidemia, COPD, stroke, GERD, anemia, sciatica, blood transfusion due to GI bleeding, who presents with rectal bleeding.  He also reported nausea, vomiting and abdominal pain.    In the ED, labs showed Hbg 11.3, elevated lipase 189, and elevated LFT's.  Hemodynamically stable.  Admitted to [facility] service with GI consulted for further evaluation and management of GI bleeding and acute pancreatitis.  Assessment & Plan   Principal Problem:   Rectal bleeding Active Problems:   Hypertension   HLD (hyperlipidemia)   GERD (gastroesophageal reflux disease)   COPD (chronic obstructive pulmonary disease) (HCC)   Anemia due to blood loss   Stroke [facility])   Alcohol abuse   Tobacco abuse   Alcoholic pancreatitis   Abnormal LFTs   Dental infection   Acute pancreatitis   Rectal bleeding with acute blood loss anemia -likely due to hemorrhoids.  Mild anemia, Hbg on admission 11.3.  Hemodynamically stable.  Has had mild intermittent bleeding since admission. Anemia panel unremarkable, only mildly reduced sat ratio. Showed normal iron, ferritin, TIBC, vitamin B12 and folate. --GI consulted, signed off --Outpatient EGD and colonoscopy --Trend hemoglobin, transfuse if less than 7 --Avoid NSAIDs and anticoagulants -- Resume oral PPI daily --Anusol suppositories twice daily -- Oral iron supplement every other day per GI  Acute pancreatitis -likely due to alcohol abuse. Abdominal pain, nausea vomiting have improved. -- Diet advanced and appears tolerating -- Stop IV fluids --Antiemetics and pain control as needed  Alcohol withdrawal syndrome, uncomplicated - patient expresses desire to detox from alcohol.   He has  mild tremors, reports intermittent mild hallucinations. --Scheduled Librium 25 mg 3 times daily, would do a longer taper at discharge --CIWA protocol with as needed Ativan --Recommend maintenance medication in the outpatient setting -specifically naltrexone or acamprosate --TOC consulted to provide local resources for assistance  Transaminitis -most likely due to alcohol abuse.  LFTs improving. Acute hepatitis panel and HIV screen negative.  Abdominal ultrasound showed:  1. Relative hypoechogenicity of the pancreatic parenchyma suggests pancreatic edema which would be consistent with the clinical history of pancreatitis. No peripancreatic fluid or pseudocyst identified. 2. Hepatic steatosis  Liver Doppler ultrasound showed: 1. Widely patent portal veins with normal hepatopetal directional flow. 2. Normally patent hepatic veins. 3. Echogenic and coarsened hepatic parenchyma suggests underlying steatosis  Dental infection -no fever or leukocytosis.  No signs of systemic infection or sepsis. --Continue amoxicillin prescribed as outpatient  Hypertension -continue home medication and as needed hydralazine  GERD -currently on IV PPI, transition back to home oral tomorrow if tolerating p.o. intake well  COPD -stable without signs of acute exacerbation. --Bronchodilators  History of stroke -not currently on home medications.  No acute issues or neurologic symptoms. --PCP follow-up  Patient BMI: Body mass index is 20.81 kg/m.   DVT prophylaxis: SCDs Start: 05/02/21 1438   Diet:  Diet Orders (From admission, onward)     Start     Ordered   05/03/21 1019  Diet regular Room service appropriate? Yes; Fluid consistency: Thin  Diet effective now       Question Answer Comment  Room service appropriate? Yes   Fluid consistency: Thin  05/03/21 1018              Code Status: Full Code   Subjective 05/04/21    Patient seen with son visiting at bedside today.  He reports  withdrawal symptoms overall fairly controlled with medications.  He does have some mild hallucinations at times, mild tremor but otherwise doing okay.  He had some nausea and mild epigastric pain after breakfast this morning but no vomiting.   Disposition Plan & Communication   Status is: Inpatient  Remains inpatient appropriate because: IV therapies and ongoing alcohol withdrawal with desire to detox.    Family Communication: Son at bedside on rounds today 10/25   Consults, Procedures, Significant Events   Consultants:  Gastroenterology  Procedures:  None  Antimicrobials:  Anti-infectives (From admission, onward)    Start     Dose/Rate Route Frequency Ordered Stop   05/02/21 2000  amoxicillin (AMOXIL) 250 MG/5ML suspension 875 mg        875 mg Oral 2 times daily 05/02/21 1439 05/08/21 2359         Micro    Objective   Vitals:   05/04/21 0057 05/04/21 0505 05/04/21 0725 05/04/21 1139  BP: (!) 152/108 (!) 165/108 (!) 143/104 (!) 161/111  Pulse: 81 78 79 88  Resp: 17 18 18 16   Temp: 98.1 F (36.7 C) 97.9 F (36.6 C) 98.6 F (37 C) 98.3 F (36.8 C)  TempSrc: Oral Oral  Oral  SpO2: 100% 97% 100% 98%  Weight:      Height:        Intake/Output Summary (Last 24 hours) at 05/04/2021 1351 Last data filed at 05/04/2021 1018 Gross per 24 hour  Intake 3517.81 ml  Output --  Net 3517.81 ml   Filed Weights   05/02/21 0726  Weight: 65.8 kg    Physical Exam:  General exam: awake, alert, no acute distress Respiratory system: lungs clear bilaterally, normal respiratory effort, on room air. Cardiovascular system: Regular rate and rhythm, no peripheral edema.   Gastrointestinal system: Abdomen soft and mildly tender in the epigastrium without rebound or guarding Central nervous system: A&O x3. no gross focal neurologic deficits, normal speech Extremities: Minimal tremor of outstretched hands bilaterally Skin: dry, intact, normal temperature Psychiatry: normal  mood, congruent affect, judgement and insight appear normal  Labs   Data Reviewed: I have personally reviewed following labs and imaging studies  CBC: Recent Labs  Lab 05/02/21 0728 05/02/21 1409 05/03/21 0447 05/04/21 0544  WBC 4.9 4.6 3.2* 3.8*  HGB 11.3* 11.3* 10.2* 9.7*  HCT 33.9* 32.9* 30.4* 29.2*  MCV 84.3 85.9 86.6 88.5  PLT 158 153 132* 138*   Basic Metabolic Panel: Recent Labs  Lab 05/02/21 0728 05/02/21 2046 05/03/21 0447 05/04/21 0544  NA 131*  --  135 137  K 3.6  --  3.5 3.5  CL 92*  --  96* 106  CO2 26  --  25 24  GLUCOSE 137*  --  96 103*  BUN 6  --  5* 5*  CREATININE 0.85  --  0.87 0.83  CALCIUM 9.4  --  8.5* 9.0  MG  --  1.8  --  1.8   GFR: Estimated Creatinine Clearance: 94.7 mL/min (by C-G formula based on SCr of 0.83 mg/dL). Liver Function Tests: Recent Labs  Lab 05/02/21 0728 05/04/21 0544  AST 182* 79*  ALT 79* 52*  ALKPHOS 110 90  BILITOT 0.8 0.7  PROT 7.6 6.4*  ALBUMIN 3.8 3.3*  Recent Labs  Lab 05/02/21 0728  LIPASE 189*   Recent Labs  Lab 05/03/21 1025  AMMONIA 18   Coagulation Profile: Recent Labs  Lab 05/02/21 1409  INR 0.9   Cardiac Enzymes: No results for input(s): CKTOTAL, CKMB, CKMBINDEX, TROPONINI in the last 168 hours. BNP (last 3 results) No results for input(s): PROBNP in the last 8760 hours. HbA1C: No results for input(s): HGBA1C in the last 72 hours. CBG: Recent Labs  Lab 05/03/21 0814 05/04/21 0726 05/04/21 0740 05/04/21 1140  GLUCAP 94 109* 95 107*   Lipid Profile: Recent Labs    05/02/21 1201  TRIG 52   Thyroid Function Tests: No results for input(s): TSH, T4TOTAL, FREET4, T3FREE, THYROIDAB in the last 72 hours. Anemia Panel: Recent Labs    05/02/21 1409 05/03/21 0447  VITAMINB12 458 442  FOLATE  --  18.8  FERRITIN  --  52  TIBC  --  350  IRON  --  48   Sepsis Labs: No results for input(s): PROCALCITON, LATICACIDVEN in the last 168 hours.  Recent Results (from the past 240  hour(s))  Resp Panel by RT-PCR (Flu A&B, Covid) Nasopharyngeal Swab     Status: None   Collection Time: 05/02/21 10:13 AM   Specimen: Nasopharyngeal Swab; Nasopharyngeal(NP) swabs in vial transport medium  Result Value Ref Range Status   SARS Coronavirus 2 by RT PCR NEGATIVE NEGATIVE Final    Comment: (NOTE) SARS-CoV-2 target nucleic acids are NOT DETECTED.  The SARS-CoV-2 RNA is generally detectable in upper respiratory specimens during the acute phase of infection. The lowest concentration of SARS-CoV-2 viral copies this assay can detect is 138 copies/mL. A negative result does not preclude SARS-Cov-2 infection and should not be used as the sole basis for treatment or other patient management decisions. A negative result may occur with  improper specimen collection/handling, submission of specimen other than nasopharyngeal swab, presence of viral mutation(s) within the areas targeted by this assay, and inadequate number of viral copies(<138 copies/mL). A negative result must be combined with clinical observations, patient history, and epidemiological information. The expected result is Negative.  Fact Sheet for Patients:  BloggerCourse.com  Fact Sheet for Healthcare Providers:  SeriousBroker.it  This test is no t yet approved or cleared by the [place] FDA and  has been authorized for detection and/or diagnosis of SARS-CoV-2 by FDA under an Emergency Use Authorization (EUA). This EUA will remain  in effect (meaning this test can be used) for the duration of the COVID-19 declaration under Section 564(b)(1) of the Act, 21 U.S.C.section 360bbb-3(b)(1), unless the authorization is terminated  or revoked sooner.       Influenza A by PCR NEGATIVE NEGATIVE Final   Influenza B by PCR NEGATIVE NEGATIVE Final    Comment: (NOTE) The Xpert Xpress SARS-CoV-2/FLU/RSV plus assay is intended as an aid in the diagnosis of influenza from  Nasopharyngeal swab specimens and should not be used as a sole basis for treatment. Nasal washings and aspirates are unacceptable for Xpert Xpress SARS-CoV-2/FLU/RSV testing.  Fact Sheet for Patients: BloggerCourse.com  Fact Sheet for Healthcare Providers: SeriousBroker.it  This test is not yet approved or cleared by the [place] FDA and has been authorized for detection and/or diagnosis of SARS-CoV-2 by FDA under an Emergency Use Authorization (EUA). This EUA will remain in effect (meaning this test can be used) for the duration of the COVID-19 declaration under Section 564(b)(1) of the Act, 21 U.S.C. section 360bbb-3(b)(1), unless the authorization is terminated or revoked.  Performed at [facility], [address] [place], [place] [address]       Imaging Studies   US Abdomen Complete  Result Date: 05/03/2021 CLINICAL DATA:  Alcohol abuse, acute alcoholic pancreatitis with elevated LFTs. EXAM: ABDOMEN ULTRASOUND COMPLETE COMPARISON:  None. FINDINGS: Gallbladder: No gallstones or wall thickening visualized. No sonographic Murphy sign noted by sonographer. Common bile duct: Diameter: Normal at 4 mm Liver: Increased parenchymal echogenicity with slight coarsening of the echotexture. The adjacent right renal parenchyma appears hypoechoic in comparison. Portal vein is patent on color Doppler imaging with normal direction of blood flow towards the liver. IVC: No abnormality visualized. Pancreas: Pancreatic parenchyma is relatively hypoechoic suggesting edema. Spleen: Size and appearance within normal limits. Right Kidney: Length: 10.7 cm. Echogenicity within normal limits. No mass or hydronephrosis visualized. Left Kidney: Length: 12.3 cm. Echogenicity within normal limits. No mass or hydronephrosis visualized. Abdominal aorta: No aneurysm visualized. Other findings: None. IMPRESSION: 1. Relative hypoechogenicity of the  pancreatic parenchyma suggests pancreatic edema which would be consistent with the clinical history of pancreatitis. No peripancreatic fluid or pseudocyst identified. 2. Hepatic steatosis. Electronically Signed   By: Malachy Moan M.D.   On: 05/03/2021 10:07   US LIVER DOPPLER  Result Date: 05/03/2021 CLINICAL DATA:  55 year old male with elevated LFTs EXAM: DUPLEX ULTRASOUND OF LIVER TECHNIQUE: Color and duplex Doppler ultrasound was performed to evaluate the hepatic in-flow and out-flow vessels. COMPARISON:  Concurrently obtained but separately dictated abdominal ultrasound FINDINGS: Liver: Slightly increased parenchymal echogenicity with coarsening of the echotexture. The adjacent renal parenchyma appears hypoechoic in comparison. No discrete lesion. Normal hepatic contour without nodularity. No focal lesion, mass or intrahepatic biliary ductal dilatation. Main Portal Vein size: 1.3 cm Portal Vein Velocities Main Prox:  22 cm/sec Main Mid: 23 cm/sec Main Dist:  28 cm/sec Right: 15 cm/sec Left: 22 cm/sec Hepatic Vein Velocities Right:  20 cm/sec Middle:  23 cm/sec Left:  79 cm/sec IVC: Present and patent with normal respiratory phasicity. Hepatic Artery Velocity:  93 cm/sec Splenic Vein Velocity:  35 cm/sec Spleen: 4.3 cm x 8.9 cm x 9.5 cm with a total volume of 188 cm^3 (411 cm^3 is upper limit normal) Portal Vein Occlusion/Thrombus: No Splenic Vein Occlusion/Thrombus: No Ascites: None Varices: None IMPRESSION: 1. Widely patent portal veins with normal hepatopetal directional flow. 2. Normally patent hepatic veins. 3. Echogenic and coarsened hepatic parenchyma suggests underlying steatosis. Electronically Signed   By: Malachy Moan M.D.   On: 05/03/2021 10:05     Medications   Scheduled Meds:  amoxicillin  875 mg Oral BID   chlordiazePOXIDE  25 mg Oral TID   cyclobenzaprine  10 mg Oral QHS   ferrous sulfate  325 mg Oral QODAY   gabapentin  100 mg Oral TID   gabapentin  600 mg Oral TID    hydrochlorothiazide  12.5 mg Oral Daily   hydrocortisone  25 mg Rectal BID   influenza vac split quadrivalent PF  0.5 mL Intramuscular Tomorrow-1000   lisinopril  20 mg Oral Daily   magnesium oxide  400 mg Oral BID   nicotine  21 mg Transdermal Daily   pantoprazole  40 mg Oral BID   thiamine  100 mg Oral Daily   Or   thiamine  100 mg Intravenous Daily   Continuous Infusions:       LOS: 1 day    Time spent: 25 minutes    Pennie Banter, DO [facility]  05/04/2021, 1:51 PM  If 7PM-7AM, please contact night-coverage. How to contact the [facility] Attending or Consulting provider [place] or covering provider during after hours [place], for this patient?    Check the care team in [facility] and look for a) attending/consulting [facility] provider listed and b) the [facility] team listed Log into [email or web address] and use [place]'s universal password to access. If you do not have the password, please contact the hospital operator. Locate the [facility] provider you are looking for under [facility] and page to a number that you can be directly reached. If you still have difficulty reaching the provider, please page the [facility] (Director on Call) for the Hospitalists listed on amion for assistance.

## 2021-05-05 ENCOUNTER — Inpatient Hospital Stay: Payer: Medicare Other

## 2021-05-05 DIAGNOSIS — J431 Panlobular emphysema: Secondary | ICD-10-CM

## 2021-05-05 DIAGNOSIS — K047 Periapical abscess without sinus: Secondary | ICD-10-CM

## 2021-05-05 DIAGNOSIS — R7401 Elevation of levels of liver transaminase levels: Secondary | ICD-10-CM

## 2021-05-05 DIAGNOSIS — E7800 Pure hypercholesterolemia, unspecified: Secondary | ICD-10-CM

## 2021-05-05 LAB — COMPREHENSIVE METABOLIC PANEL
ALT: 59 U/L — ABNORMAL HIGH (ref 0–44)
AST: 75 U/L — ABNORMAL HIGH (ref 15–41)
Albumin: 3.7 g/dL (ref 3.5–5.0)
Alkaline Phosphatase: 108 U/L (ref 38–126)
Anion gap: 8 (ref 5–15)
BUN: 7 mg/dL (ref 6–20)
CO2: 23 mmol/L (ref 22–32)
Calcium: 9.6 mg/dL (ref 8.9–10.3)
Chloride: 104 mmol/L (ref 98–111)
Creatinine, Ser: 0.84 mg/dL (ref 0.61–1.24)
GFR, Estimated: >60 mL/min (ref >60)
Glucose, Bld: 110 mg/dL — ABNORMAL HIGH (ref 70–99)
Potassium: 3.6 mmol/L (ref 3.5–5.1)
Sodium: 135 mmol/L (ref 135–145)
Total Bilirubin: 0.5 mg/dL (ref 0.3–1.2)
Total Protein: 7.1 g/dL (ref 6.5–8.1)

## 2021-05-05 LAB — CBC
HCT: 32.4 % — ABNORMAL LOW (ref 39.0–52.0)
Hemoglobin: 11 g/dL — ABNORMAL LOW (ref 13.0–17.0)
MCH: 29.3 pg (ref 26.0–34.0)
MCHC: 34 g/dL (ref 30.0–36.0)
MCV: 86.2 fL (ref 80.0–100.0)
Platelets: 168 10*3/uL (ref 150–400)
RBC: 3.76 MIL/uL — ABNORMAL LOW (ref 4.22–5.81)
RDW: 16 % — ABNORMAL HIGH (ref 11.5–15.5)
WBC: 5.2 10*3/uL (ref 4.0–10.5)
nRBC: 0 % (ref 0.0–0.2)

## 2021-05-05 MED ORDER — LISINOPRIL 20 MG PO TABS
40.0000 mg | ORAL_TABLET | Freq: Every day | ORAL | Status: DC
  Administered 2021-05-06 – 2021-05-09 (×4): 40 mg via ORAL
  Filled 2021-05-05 (×4): qty 2

## 2021-05-05 MED ORDER — ACETAMINOPHEN 325 MG PO TABS
650.0000 mg | ORAL_TABLET | Freq: Four times a day (QID) | ORAL | Status: DC | PRN
  Administered 2021-05-05 – 2021-05-07 (×3): 650 mg via ORAL
  Filled 2021-05-05 (×3): qty 2

## 2021-05-05 MED ORDER — OXYCODONE HCL 5 MG PO TABS
5.0000 mg | ORAL_TABLET | Freq: Four times a day (QID) | ORAL | Status: DC | PRN
Start: 2021-05-05 — End: 2021-05-09
  Administered 2021-05-06 – 2021-05-09 (×8): 5 mg via ORAL
  Filled 2021-05-05 (×8): qty 1

## 2021-05-05 MED ORDER — CYCLOBENZAPRINE HCL 10 MG PO TABS
10.0000 mg | ORAL_TABLET | Freq: Three times a day (TID) | ORAL | Status: DC | PRN
  Administered 2021-05-07 – 2021-05-09 (×3): 10 mg via ORAL
  Filled 2021-05-05 (×3): qty 1

## 2021-05-05 MED ORDER — CHLORDIAZEPOXIDE HCL 5 MG PO CAPS
60.0000 mg | ORAL_CAPSULE | Freq: Three times a day (TID) | ORAL | Status: DC
  Administered 2021-05-05 – 2021-05-09 (×11): 60 mg via ORAL
  Filled 2021-05-05 (×11): qty 2

## 2021-05-05 NOTE — Progress Notes (Signed)
PROGRESS NOTE    Ronald Watts  OEH:[id number] DOB: 02/22/1966 DOA: 05/02/2021 PCP: Leanna Sato, MD   Brief Narrative:  55 y.o. 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  He also reported nausea, vomiting and abdominal pain.     In the ED, labs showed Hbg 11.3, elevated lipase 189, and elevated LFT's.  Hemodynamically stable.  Admitted to [facility] service with GI consulted for further evaluation and management of GI bleeding and acute pancreatitis.   Subjective: A/O x4, negative CP, complaint of L-spine pain with radiation down his RIGHT leg.  States injured lower back getting up off of toilet seat.  Positive RIGHT shoulder pain/muscle spasm in deltoid/trapezius muscles.  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  States had ABI lower extremities performed recently at [facility].  Also complains of RIGHT knee pain, RIGHT ankle pain, bilateral lower extremity pain (all chronic)   Assessment & Plan:  Covid vaccination;  Principal Problem:   Rectal bleeding Active Problems:   Hypertension   HLD (hyperlipidemia)   GERD (gastroesophageal reflux disease)   COPD (chronic obstructive pulmonary disease) (HCC)   Anemia due to blood loss   Stroke [facility])   Alcohol abuse   Tobacco abuse   Alcoholic pancreatitis   Abnormal LFTs   Dental infection   Acute pancreatitis   Rectal bleeding with mild blood loss anemia  -likely due to hemorrhoids.   -Hemoglobin on admission 11.3.  Hemodynamically stable.  -Has had mild intermittent bleeding since admission -Anemia panel unremarkable, only mildly reduced sat ratio. -Showed normal iron, ferritin, TIBC, vitamin B12 and folate. --GI consulted, signed off --Outpatient EGD and colonoscopy -- Transfuse for hemoglobin <7  --Avoid NSAIDs and anticoagulants -- Resume oral PPI daily --Anusol suppositories twice daily -- Oral iron  supplement every other day per GI   Acute pancreatitis  -likely due to alcohol abuse. -Abdominal pain, nausea vomiting have improved. -- Diet advanced and appears tolerating -- Stop IV fluids --Antiemetics and pain control as needed -10/26 resolved   ETOH abuse with withdrawal syndrome, uncomplicated  - patient expresses desire to detox from alcohol.   He has mild tremors, reports intermittent mild hallucinations. --CIWA protocol with as needed Ativan --Recommend maintenance medication in the outpatient setting -specifically naltrexone or acamprosate.  Would not dispense medication at discharge.  Would allow EtOH program to dispense medication in order to monitor for any side effects -- 10/26 TOC consulted to provide local resources for assistance   Transaminitis  -most likely due to alcohol abuse.  LFTs improving. Acute hepatitis panel and HIV screen negative.     Dental infection  -no fever or leukocytosis.  No signs of systemic infection or sepsis. --Continue amoxicillin prescribed as outpatient   HTN -continue home medication and as needed hydralazine  GERD  -currently on IV PPI, transition back to home oral 10/27 if tolerating p.o. intake well   COPD/Tobacco abuse  -stable without signs of acute exacerbation. --Bronchodilators -Patient and wife counseled extensively on need to discontinue smoking. -Nicotine patch   Hx stroke -not currently on home medications.   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   -No acute issues or neurologic symptoms. --PCP follow-up   Patient BMI: Body mass index is 20.81 kg/m.    DVT prophylaxis: SCD Code Status: Full Family Communication: 10/26 wife bedside for discussion of plan of care all questions answered Status is: Inpatient  Dispo: The patient is from: Home              Anticipated d/c is to: SNF              Anticipated d/c date is: 2 days              Patient currently is not medically stable to  d/c.      Consultants:  GI DO Nanetta Batty   Procedures/Significant Events:  Abdominal ultrasound showed:  1. Relative hypoechogenicity of the pancreatic parenchyma suggests pancreatic edema which would be consistent with the clinical history of pancreatitis. No peripancreatic fluid or pseudocyst identified. 2. Hepatic steatosis   Liver Doppler ultrasound showed: 1. Widely patent portal veins with normal hepatopetal directional flow. 2. Normally patent hepatic veins. 3. Echogenic and coarsened hepatic parenchyma suggests underlying steatosis   I have personally reviewed and interpreted all radiology studies and my findings are as above.  VENTILATOR SETTINGS:    Cultures   Antimicrobials: Anti-infectives (From admission, onward)    Start     Dose/Rate Route Frequency Ordered Stop   05/02/21 2000  amoxicillin (AMOXIL) 250 MG/5ML suspension 875 mg        875 mg Oral 2 times daily 05/02/21 1439 05/08/21 2359         Devices    LINES / TUBES:      Continuous Infusions:   Objective: Vitals:   05/04/21 1636 05/04/21 2235 05/04/21 2301 05/05/21 0528  BP: (!) 155/119 (!) 146/129 (!) 148/120 (!) 156/113  Pulse: (!) 103 (!) 109 (!) 110 80  Resp: 16 18 18 18   Temp: 98.7 F (37.1 C) 97.6 F (36.4 C) 97.9 F (36.6 C) 97.7 F (36.5 C)  TempSrc: Oral     SpO2: 96% 98% 97% 96%  Weight:      Height:        Intake/Output Summary (Last 24 hours) at 05/05/2021 05/07/2021 Last data filed at 05/04/2021 1018 Gross per 24 hour  Intake 480 ml  Output --  Net 480 ml   Filed Weights   05/02/21 0726  Weight: 65.8 kg    Examination:  General: A/O x4 No acute respiratory distress, cachectic Eyes: negative scleral hemorrhage, negative anisocoria, negative icterus ENT: Negative Runny nose, negative gingival bleeding, Neck:  Negative scars, masses, torticollis, lymphadenopathy, JVD Lungs: Clear to auscultation bilaterally without wheezes or crackles Cardiovascular:  Regular rate and rhythm without murmur gallop or rub normal S1 and S2 Abdomen: negative abdominal pain, nondistended, positive soft, bowel sounds, no rebound, no ascites, no appreciable mass Extremities: pain to palpation RIGHT shoulder Skin: Negative rashes, lesions, ulcers Musculoskeletal: Pain to palpation midline L1-S2. Psychiatric:  Negative depression, positive anxiety, negative fatigue, negative mania  Central nervous system:  Cranial nerves II through XII intact, tongue/uvula midline, all extremities muscle strength 5/5, sensation intact throughout, negative dysarthria, negative expressive aphasia, negative receptive aphasia.  .     Data Reviewed: Care during the described time interval was provided by me .  I have reviewed this patient's available data, including medical history, events of note, physical examination, and all test results as part of my evaluation.   CBC: Recent Labs  Lab 05/02/21 0728 05/02/21 1409 05/03/21 0447 05/04/21 0544 05/05/21 0406  WBC 4.9 4.6 3.2* 3.8* 5.2  HGB 11.3* 11.3* 10.2* 9.7* 11.0*  HCT 33.9* 32.9* 30.4* 29.2* 32.4*  MCV 84.3 85.9 86.6 88.5 86.2  PLT 158 153 132* 138* 168   Basic Metabolic Panel: Recent Labs  Lab 05/02/21 0728 05/02/21 2046 05/03/21 0447 05/04/21 0544 05/05/21 0406  NA 131*  --  135 137 135  K 3.6  --  3.5 3.5 3.6  CL 92*  --  96* 106 104  CO2 26  --  25 24 23   GLUCOSE 137*  --  96 103* 110*  BUN 6  --  5* 5* 7  CREATININE 0.85  --  0.87 0.83 0.84  CALCIUM 9.4  --  8.5* 9.0 9.6  MG  --  1.8  --  1.8  --    GFR: Estimated Creatinine Clearance: 93.6 mL/min (by C-G formula based on SCr of 0.84 mg/dL). Liver Function Tests: Recent Labs  Lab 05/02/21 0728 05/04/21 0544 05/05/21 0406  AST 182* 79* 75*  ALT 79* 52* 59*  ALKPHOS 110 90 108  BILITOT 0.8 0.7 0.5  PROT 7.6 6.4* 7.1  ALBUMIN 3.8 3.3* 3.7   Recent Labs  Lab 05/02/21 0728  LIPASE 189*   Recent Labs  Lab 05/03/21 1025  AMMONIA 18    Coagulation Profile: Recent Labs  Lab 05/02/21 1409  INR 0.9   Cardiac Enzymes: No results for input(s): CKTOTAL, CKMB, CKMBINDEX, TROPONINI in the last 168 hours. BNP (last 3 results) No results for input(s): PROBNP in the last 8760 hours. HbA1C: No results for input(s): HGBA1C in the last 72 hours. CBG: Recent Labs  Lab 05/03/21 0814 05/04/21 0726 05/04/21 0740 05/04/21 1140  GLUCAP 94 109* 95 107*   Lipid Profile: Recent Labs    05/02/21 1201  TRIG 52   Thyroid Function Tests: No results for input(s): TSH, T4TOTAL, FREET4, T3FREE, THYROIDAB in the last 72 hours. Anemia Panel: Recent Labs    05/02/21 1409 05/03/21 0447  VITAMINB12 458 442  FOLATE  --  18.8  FERRITIN  --  52  TIBC  --  350  IRON  --  48   Urine analysis:    Component Value Date/Time   COLORURINE YELLOW (A) 05/02/2021 1303   APPEARANCEUR CLEAR (A) 05/02/2021 1303   LABSPEC 1.008 05/02/2021 1303   PHURINE 7.0 05/02/2021 1303   GLUCOSEU NEGATIVE 05/02/2021 1303   HGBUR NEGATIVE 05/02/2021 1303   BILIRUBINUR NEGATIVE 05/02/2021 1303   KETONESUR NEGATIVE 05/02/2021 1303   PROTEINUR NEGATIVE 05/02/2021 1303   NITRITE NEGATIVE 05/02/2021 1303   LEUKOCYTESUR NEGATIVE 05/02/2021 1303   Sepsis Labs: @LABRCNTIP (procalcitonin:4,lacticidven:4)  ) Recent Results (from the past 240 hour(s))  Resp Panel by RT-PCR (Flu A&B, Covid) Nasopharyngeal Swab     Status: None   Collection Time: 05/02/21 10:13 AM   Specimen: Nasopharyngeal Swab; Nasopharyngeal(NP) swabs in vial transport medium  Result Value Ref Range Status   SARS Coronavirus 2 by RT PCR NEGATIVE NEGATIVE Final    Comment: (NOTE) SARS-CoV-2 target nucleic acids are NOT DETECTED.  The SARS-CoV-2 RNA is generally detectable in upper respiratory specimens during the acute phase of infection. The lowest concentration of SARS-CoV-2 viral copies this assay can detect is 138 copies/mL. A negative result does not preclude  SARS-Cov-2 infection and should not be used as the sole basis for treatment or other patient management decisions. A negative result may occur with  improper specimen collection/handling, submission of specimen other than nasopharyngeal swab, presence of viral mutation(s) within the areas targeted by this assay, and inadequate number of viral copies(<138 copies/mL). A negative result must be combined with clinical observations, patient history, and epidemiological information. The expected result is Negative.  Fact Sheet for Patients:   Fact Sheet  for Healthcare Providers:  SeriousBroker.it  This test is no t yet approved or cleared by the [place] and  has been authorized for detection and/or diagnosis of SARS-CoV-2 by FDA under an Emergency Use Authorization (EUA). This EUA will remain  in effect (meaning this test can be used) for the duration of the COVID-19 declaration under Section 564(b)(1) of the Act, 21 U.S.C.section 360bbb-3(b)(1), unless the authorization is terminated  or revoked sooner.       Influenza A by PCR NEGATIVE NEGATIVE Final   Influenza B by PCR NEGATIVE NEGATIVE Final    Comment: (NOTE) The Xpert Xpress SARS-CoV-2/FLU/RSV plus assay is intended as an aid in the diagnosis of influenza from Nasopharyngeal swab specimens and should not be used as a sole basis for treatment. Nasal washings and aspirates are unacceptable for Xpert Xpress SARS-CoV-2/FLU/RSV testing.  Fact Sheet for Patients: BloggerCourse.com  Fact Sheet for Healthcare Providers: SeriousBroker.it  This test is not yet approved or cleared by the [place] FDA and has been authorized for detection and/or diagnosis of SARS-CoV-2 by FDA under an Emergency Use Authorization (EUA). This EUA will remain in effect (meaning this test can be used) for the duration of  the COVID-19 declaration under Section 564(b)(1) of the Act, 21 U.S.C. section 360bbb-3(b)(1), unless the authorization is terminated or revoked.  Performed at [facility], [address] [place], [place] [address]          Radiology Studies: US Abdomen Complete  Result Date: 05/03/2021 CLINICAL DATA:  Alcohol abuse, acute alcoholic pancreatitis with elevated LFTs. EXAM: ABDOMEN ULTRASOUND COMPLETE COMPARISON:  None. FINDINGS: Gallbladder: No gallstones or wall thickening visualized. No sonographic Murphy sign noted by sonographer. Common bile duct: Diameter: Normal at 4 mm Liver: Increased parenchymal echogenicity with slight coarsening of the echotexture. The adjacent right renal parenchyma appears hypoechoic in comparison. Portal vein is patent on color Doppler imaging with normal direction of blood flow towards the liver. IVC: No abnormality visualized. Pancreas: Pancreatic parenchyma is relatively hypoechoic suggesting edema. Spleen: Size and appearance within normal limits. Right Kidney: Length: 10.7 cm. Echogenicity within normal limits. No mass or hydronephrosis visualized. Left Kidney: Length: 12.3 cm. Echogenicity within normal limits. No mass or hydronephrosis visualized. Abdominal aorta: No aneurysm visualized. Other findings: None. IMPRESSION: 1. Relative hypoechogenicity of the pancreatic parenchyma suggests pancreatic edema which would be consistent with the clinical history of pancreatitis. No peripancreatic fluid or pseudocyst identified. 2. Hepatic steatosis. Electronically Signed   By: Malachy Moan M.D.   On: 05/03/2021 10:07   US LIVER DOPPLER  Result Date: 05/03/2021 CLINICAL DATA:  55 year old male with elevated LFTs EXAM: DUPLEX ULTRASOUND OF LIVER TECHNIQUE: Color and duplex Doppler ultrasound was performed to evaluate the hepatic in-flow and out-flow vessels. COMPARISON:  Concurrently obtained but separately dictated abdominal ultrasound FINDINGS:  Liver: Slightly increased parenchymal echogenicity with coarsening of the echotexture. The adjacent renal parenchyma appears hypoechoic in comparison. No discrete lesion. Normal hepatic contour without nodularity. No focal lesion, mass or intrahepatic biliary ductal dilatation. Main Portal Vein size: 1.3 cm Portal Vein Velocities Main Prox:  22 cm/sec Main Mid: 23 cm/sec Main Dist:  28 cm/sec Right: 15 cm/sec Left: 22 cm/sec Hepatic Vein Velocities Right:  20 cm/sec Middle:  23 cm/sec Left:  79 cm/sec IVC: Present and patent with normal respiratory phasicity. Hepatic Artery Velocity:  93 cm/sec Splenic Vein Velocity:  35 cm/sec Spleen: 4.3 cm x 8.9 cm x 9.5 cm with a total volume of 188 cm^3 (411  cm^3 is upper limit normal) Portal Vein Occlusion/Thrombus: No Splenic Vein Occlusion/Thrombus: No Ascites: None Varices: None IMPRESSION: 1. Widely patent portal veins with normal hepatopetal directional flow. 2. Normally patent hepatic veins. 3. Echogenic and coarsened hepatic parenchyma suggests underlying steatosis. Electronically Signed   By: Malachy Moan M.D.   On: 05/03/2021 10:05        Scheduled Meds:  amoxicillin  875 mg Oral BID   chlordiazePOXIDE  25 mg Oral TID   cyclobenzaprine  10 mg Oral QHS   ferrous sulfate  325 mg Oral QODAY   gabapentin  100 mg Oral TID   gabapentin  600 mg Oral TID   hydrochlorothiazide  12.5 mg Oral Daily   hydrocortisone  25 mg Rectal BID   influenza vac split quadrivalent PF  0.5 mL Intramuscular Tomorrow-1000   lisinopril  20 mg Oral Daily   LORazepam  0-4 mg Oral Q4H   Or   LORazepam  0-4 mg Intravenous Q4H   magnesium oxide  400 mg Oral BID   nicotine  21 mg Transdermal Daily   pantoprazole  40 mg Oral BID   thiamine  100 mg Oral Daily   Or   thiamine  100 mg Intravenous Daily   Continuous Infusions:   LOS: 2 days   The patient is critically ill with multiple organ systems failure and requires high complexity decision making for assessment and  support, frequent evaluation and titration of therapies, application of advanced monitoring technologies and extensive interpretation of multiple databases. Critical Care Time devoted to patient care services described in this note  Time spent: 40 minutes     Kadan Millstein, Roselind Messier, MD [facility]   If 7PM-7AM, please contact night-coverage 05/05/2021, 7:02 AM

## 2021-05-06 ENCOUNTER — Inpatient Hospital Stay: Payer: Medicare Other

## 2021-05-06 DIAGNOSIS — M5416 Radiculopathy, lumbar region: Secondary | ICD-10-CM

## 2021-05-06 DIAGNOSIS — S42001A Fracture of unspecified part of right clavicle, initial encounter for closed fracture: Secondary | ICD-10-CM

## 2021-05-06 DIAGNOSIS — I6300 Cerebral infarction due to thrombosis of unspecified precerebral artery: Secondary | ICD-10-CM

## 2021-05-06 LAB — CBC WITH DIFFERENTIAL/PLATELET
Abs Immature Granulocytes: 0.02 10*3/uL (ref 0.00–0.07)
Basophils Absolute: 0 10*3/uL (ref 0.0–0.1)
Basophils Relative: 0 %
Eosinophils Absolute: 0.1 10*3/uL (ref 0.0–0.5)
Eosinophils Relative: 3 %
HCT: 31.6 % — ABNORMAL LOW (ref 39.0–52.0)
Hemoglobin: 10.7 g/dL — ABNORMAL LOW (ref 13.0–17.0)
Immature Granulocytes: 0 %
Lymphocytes Relative: 19 %
Lymphs Abs: 0.9 10*3/uL (ref 0.7–4.0)
MCH: 29.6 pg (ref 26.0–34.0)
MCHC: 33.9 g/dL (ref 30.0–36.0)
MCV: 87.3 fL (ref 80.0–100.0)
Monocytes Absolute: 0.6 10*3/uL (ref 0.1–1.0)
Monocytes Relative: 13 %
Neutro Abs: 3.2 10*3/uL (ref 1.7–7.7)
Neutrophils Relative %: 65 %
Platelets: 170 10*3/uL (ref 150–400)
RBC: 3.62 MIL/uL — ABNORMAL LOW (ref 4.22–5.81)
RDW: 16.4 % — ABNORMAL HIGH (ref 11.5–15.5)
WBC: 4.9 10*3/uL (ref 4.0–10.5)
nRBC: 0 % (ref 0.0–0.2)

## 2021-05-06 LAB — COMPREHENSIVE METABOLIC PANEL
ALT: 54 U/L — ABNORMAL HIGH (ref 0–44)
AST: 57 U/L — ABNORMAL HIGH (ref 15–41)
Albumin: 3.5 g/dL (ref 3.5–5.0)
Alkaline Phosphatase: 100 U/L (ref 38–126)
Anion gap: 10 (ref 5–15)
BUN: 12 mg/dL (ref 6–20)
CO2: 24 mmol/L (ref 22–32)
Calcium: 9.7 mg/dL (ref 8.9–10.3)
Chloride: 103 mmol/L (ref 98–111)
Creatinine, Ser: 0.94 mg/dL (ref 0.61–1.24)
GFR, Estimated: >60 mL/min (ref >60)
Glucose, Bld: 103 mg/dL — ABNORMAL HIGH (ref 70–99)
Potassium: 3.4 mmol/L — ABNORMAL LOW (ref 3.5–5.1)
Sodium: 137 mmol/L (ref 135–145)
Total Bilirubin: 0.5 mg/dL (ref 0.3–1.2)
Total Protein: 7 g/dL (ref 6.5–8.1)

## 2021-05-06 LAB — PHOSPHORUS: Phosphorus: 5.4 mg/dL — ABNORMAL HIGH (ref 2.5–4.6)

## 2021-05-06 LAB — MAGNESIUM: Magnesium: 1.9 mg/dL (ref 1.7–2.4)

## 2021-05-06 LAB — LIPASE, BLOOD: Lipase: 102 U/L — ABNORMAL HIGH (ref 11–51)

## 2021-05-06 MED ORDER — GADOBUTROL 1 MMOL/ML IV SOLN
6.0000 mL | Freq: Once | INTRAVENOUS | Status: AC | PRN
  Administered 2021-05-06: 10:00:00 6 mL via INTRAVENOUS

## 2021-05-06 NOTE — Care Management Important Message (Signed)
Important Message  Patient Details  Name: Ronald Watts MRN: [id number] Date of Birth: [birth year]   Medicare Important Message Given:  Yes     Olegario Messier A Damani Kelemen 05/06/2021, 12:08 PM

## 2021-05-06 NOTE — Progress Notes (Signed)
PROGRESS NOTE    Ronald Watts  BOF:[id number] DOB: 1966/06/15 DOA: 05/02/2021 PCP: Leanna Sato, MD   Brief Narrative:  55 y.o. 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  He also reported nausea, vomiting and abdominal pain.     In the ED, labs showed Hbg 11.3, elevated lipase 189, and elevated LFT's.  Hemodynamically stable.  Admitted to [facility] service with GI consulted for further evaluation and management of GI bleeding and acute pancreatitis.   Subjective: 10/27/A/O x4.  Continued back pain.  Continue RIGHT lower extremity pain, and right trapezius/deltoid muscle spasm.    Assessment & Plan:  Covid vaccination;  Principal Problem:   Rectal bleeding Active Problems:   Hypertension   HLD (hyperlipidemia)   GERD (gastroesophageal reflux disease)   COPD (chronic obstructive pulmonary disease) (HCC)   Anemia due to blood loss   Stroke [facility])   Alcohol abuse   Tobacco abuse   Alcoholic pancreatitis   Abnormal LFTs   Dental infection   Acute pancreatitis   Lumbar nerve root impingement   Right clavicle fracture   Rectal bleeding with mild blood loss anemia  -likely due to hemorrhoids.   -Hemoglobin on admission 11.3.  Hemodynamically stable.  -Has had mild intermittent bleeding since admission -Anemia panel unremarkable, only mildly reduced sat ratio. -Showed normal iron, ferritin, TIBC, vitamin B12 and folate. --GI consulted, signed off --Outpatient EGD and colonoscopy -- Transfuse for hemoglobin <7  --Avoid NSAIDs and anticoagulants -- Resume oral PPI daily --Anusol suppositories twice daily -- Oral iron supplement every other day per GI   Acute pancreatitis  -likely due to alcohol abuse. -Abdominal pain, nausea vomiting have improved. -- Diet advanced and appears tolerating -- Stop IV fluids --Antiemetics and pain control as  needed -10/26 resolved   ETOH abuse with withdrawal syndrome, uncomplicated  - patient expresses desire to detox from alcohol.   He has mild tremors, reports intermittent mild hallucinations. --CIWA protocol with as needed Ativan --Recommend maintenance medication in the outpatient setting -specifically naltrexone or acamprosate.  Would not dispense medication at discharge.  Would allow EtOH program to dispense medication in order to monitor for any side effects -- 10/26 TOC consulted to provide local resources for assistance   Transaminitis  -most likely due to alcohol abuse.  LFTs improving. Acute hepatitis panel and HIV screen negative.     Dental infection  -no fever or leukocytosis.  No signs of systemic infection or sepsis. --Continue amoxicillin prescribed as outpatient   HTN -continue home medication and as needed hydralazine  GERD  -currently on IV PPI, transition back to home oral 10/27 if tolerating p.o. intake well   COPD/Tobacco abuse  -stable without signs of acute exacerbation. --Bronchodilators -Patient and wife counseled extensively on need to discontinue smoking. -Nicotine patch   Hx stroke -not currently on home medications.   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   -No acute issues or neurologic symptoms. --PCP follow-up  DJD L-spine - 10/27 advanced degenerative changes L5-S1 see results below.  Consistent with patient's pain and sciatica.   -10/27 MRI L-spine shows possible nerve root compression see results below.  Will consult neurosurgeon; amenable to surgical correction?    RIGHT clavicle fracture POA - Old right clavicle fracture see x-ray results below  Bilateral lower extremity PVD - 9/23 ABI performed by Dr. Dorothyann Peng at [place] [facility]; Normal ABIs no evidence  of significant peripheral disease   Patient BMI: Body mass index is 20.81 kg/m.    DVT prophylaxis: SCD Code Status: Full Family Communication: 10/26 wife bedside  for discussion of plan of care all questions answered Status is: Inpatient    Dispo: The patient is from: Home              Anticipated d/c is to: SNF              Anticipated d/c date is: 2 days              Patient currently is not medically stable to d/c.      Consultants:  GI DO Nanetta Batty   Procedures/Significant Events:  Abdominal ultrasound showed:  1. Relative hypoechogenicity of the pancreatic parenchyma suggests pancreatic edema which would be consistent with the clinical history of pancreatitis. No peripancreatic fluid or pseudocyst identified. 2. Hepatic steatosis   Liver Doppler ultrasound showed: 1. Widely patent portal veins with normal hepatopetal directional flow. 2. Normally patent hepatic veins. 3. Echogenic and coarsened hepatic parenchyma suggests underlying steatosis 10/26 DG L spine:-Multilevel degenerative change with moderate disc space narrowing at L4-L5  -Advanced degenerative changes at L5-S1. Facet degenerative changes of the lower lumbar spine.  -Mild degenerative changes elsewhere in the spine.  -Possible punctate left kidney stones. 10/26 DG RIGHT shoulder:No acute osseous abnormality.  Old right clavicle fracture 10/27 MRI L-spine W/W. Wo contrast:No high-grade canal stenosis. Foraminal narrowing is greatest at L5-S1. Potential exiting nerve root compression on the left at L3-L4 and bilaterally at L5-S1.   I have personally reviewed and interpreted all radiology studies and my findings are as above.  VENTILATOR SETTINGS:    Cultures   Antimicrobials: Anti-infectives (From admission, onward)    Start     Dose/Rate Route Frequency Ordered Stop   05/02/21 2000  amoxicillin (AMOXIL) 250 MG/5ML suspension 875 mg        875 mg Oral 2 times daily 05/02/21 1439 05/08/21 2359         Devices    LINES / TUBES:      Continuous Infusions:   Objective: Vitals:   05/06/21 1109 05/06/21 1615 05/06/21 2032 05/07/21 0336  BP:  (!) 141/101 (!) 136/112 120/90 (!) 128/112  Pulse: 100 (!) 103 92 (!) 101  Resp: 18 18 16 16   Temp: 98.8 F (37.1 C) 98.9 F (37.2 C) (!) 97.5 F (36.4 C) 97.8 F (36.6 C)  TempSrc:  Oral Oral   SpO2: 99% 99% 97% 98%  Weight:      Height:        Intake/Output Summary (Last 24 hours) at 05/07/2021 6203 Last data filed at 05/07/2021 0341 Gross per 24 hour  Intake 834 ml  Output --  Net 834 ml   Filed Weights   05/02/21 0726  Weight: 65.8 kg    Examination:  General: A/O x4 No acute respiratory distress, cachectic Eyes: negative scleral hemorrhage, negative anisocoria, negative icterus ENT: Negative Runny nose, negative gingival bleeding, Neck:  Negative scars, masses, torticollis, lymphadenopathy, JVD Lungs: Clear to auscultation bilaterally without wheezes or crackles Cardiovascular: Regular rate and rhythm without murmur gallop or rub normal S1 and S2 Abdomen: negative abdominal pain, nondistended, positive soft, bowel sounds, no rebound, no ascites, no appreciable mass Extremities: pain to palpation RIGHT shoulder Skin: Negative rashes, lesions, ulcers Musculoskeletal: Pain to palpation midline L1-S2. Psychiatric:  Negative depression, positive anxiety, negative fatigue, negative mania  Central nervous system:  Cranial nerves II through  XII intact, tongue/uvula midline, all extremities muscle strength 5/5, except for RIGHT lower extremity strength 4/5 with straight leg raise and dorsiflexion of foot.  In addition pain in back with RIGHT straight leg movement.  Sensation intact throughout, negative dysarthria, negative expressive aphasia, negative receptive aphasia.  .     Data Reviewed: Care during the described time interval was provided by me .  I have reviewed this patient's available data, including medical history, events of note, physical examination, and all test results as part of my evaluation.   CBC: Recent Labs  Lab 05/03/21 0447 05/04/21 0544  05/05/21 0406 05/06/21 0601 05/07/21 0635  WBC 3.2* 3.8* 5.2 4.9 5.7  NEUTROABS  --   --   --  3.2 3.2  HGB 10.2* 9.7* 11.0* 10.7* 10.1*  HCT 30.4* 29.2* 32.4* 31.6* 30.9*  MCV 86.6 88.5 86.2 87.3 87.8  PLT 132* 138* 168 170 202   Basic Metabolic Panel: Recent Labs  Lab 05/02/21 2046 05/03/21 0447 05/04/21 0544 05/05/21 0406 05/06/21 0601 05/07/21 0635  NA  --  135 137 135 137 136  K  --  3.5 3.5 3.6 3.4* 3.5  CL  --  96* 106 104 103 103  CO2  --  25 24 23 24 27   GLUCOSE  --  96 103* 110* 103* 102*  BUN  --  5* 5* 7 12 10   CREATININE  --  0.87 0.83 0.84 0.94 1.01  CALCIUM  --  8.5* 9.0 9.6 9.7 9.3  MG 1.8  --  1.8  --  1.9 2.0  PHOS  --   --   --   --  5.4* 4.6   GFR: Estimated Creatinine Clearance: 77.8 mL/min (by C-G formula based on SCr of 1.01 mg/dL). Liver Function Tests: Recent Labs  Lab 05/02/21 0728 05/04/21 0544 05/05/21 0406 05/06/21 0601 05/07/21 0635  AST 182* 79* 75* 57* 50*  ALT 79* 52* 59* 54* 49*  ALKPHOS 110 90 108 100 91  BILITOT 0.8 0.7 0.5 0.5 0.5  PROT 7.6 6.4* 7.1 7.0 7.0  ALBUMIN 3.8 3.3* 3.7 3.5 3.5   Recent Labs  Lab 05/02/21 0728 05/06/21 0601 05/07/21 0635  LIPASE 189* 102* 95*   Recent Labs  Lab 05/03/21 1025  AMMONIA 18   Coagulation Profile: Recent Labs  Lab 05/02/21 1409  INR 0.9   Cardiac Enzymes: No results for input(s): CKTOTAL, CKMB, CKMBINDEX, TROPONINI in the last 168 hours. BNP (last 3 results) No results for input(s): PROBNP in the last 8760 hours. HbA1C: No results for input(s): HGBA1C in the last 72 hours. CBG: Recent Labs  Lab 05/03/21 0814 05/04/21 0726 05/04/21 0740 05/04/21 1140  GLUCAP 94 109* 95 107*   Lipid Profile: No results for input(s): CHOL, HDL, LDLCALC, TRIG, CHOLHDL, LDLDIRECT in the last 72 hours.  Thyroid Function Tests: No results for input(s): TSH, T4TOTAL, FREET4, T3FREE, THYROIDAB in the last 72 hours. Anemia Panel: No results for input(s): VITAMINB12, FOLATE, FERRITIN,  TIBC, IRON, RETICCTPCT in the last 72 hours.  Urine analysis:    Component Value Date/Time   COLORURINE YELLOW (A) 05/02/2021 1303   APPEARANCEUR CLEAR (A) 05/02/2021 1303   LABSPEC 1.008 05/02/2021 1303   PHURINE 7.0 05/02/2021 1303   GLUCOSEU NEGATIVE 05/02/2021 1303   HGBUR NEGATIVE 05/02/2021 1303   BILIRUBINUR NEGATIVE 05/02/2021 1303   KETONESUR NEGATIVE 05/02/2021 1303   PROTEINUR NEGATIVE 05/02/2021 1303   NITRITE NEGATIVE 05/02/2021 1303   LEUKOCYTESUR NEGATIVE 05/02/2021 1303   Sepsis Labs: @  LABRCNTIP(procalcitonin:4,lacticidven:4)  ) Recent Results (from the past 240 hour(s))  Resp Panel by RT-PCR (Flu A&B, Covid) Nasopharyngeal Swab     Status: None   Collection Time: 05/02/21 10:13 AM   Specimen: Nasopharyngeal Swab; Nasopharyngeal(NP) swabs in vial transport medium  Result Value Ref Range Status   SARS Coronavirus 2 by RT PCR NEGATIVE NEGATIVE Final    Comment: (NOTE) SARS-CoV-2 target nucleic acids are NOT DETECTED.  The SARS-CoV-2 RNA is generally detectable in upper respiratory specimens during the acute phase of infection. The lowest concentration of SARS-CoV-2 viral copies this assay can detect is 138 copies/mL. A negative result does not preclude SARS-Cov-2 infection and should not be used as the sole basis for treatment or other patient management decisions. A negative result may occur with  improper specimen collection/handling, submission of specimen other than nasopharyngeal swab, presence of viral mutation(s) within the areas targeted by this assay, and inadequate number of viral copies(<138 copies/mL). A negative result must be combined with clinical observations, patient history, and epidemiological information. The expected result is Negative.  Fact Sheet for Patients:  BloggerCourse.com  Fact Sheet for Healthcare Providers:  SeriousBroker.it  This test is no t yet approved or cleared by the  [place] FDA and  has been authorized for detection and/or diagnosis of SARS-CoV-2 by FDA under an Emergency Use Authorization (EUA). This EUA will remain  in effect (meaning this test can be used) for the duration of the COVID-19 declaration under Section 564(b)(1) of the Act, 21 U.S.C.section 360bbb-3(b)(1), unless the authorization is terminated  or revoked sooner.       Influenza A by PCR NEGATIVE NEGATIVE Final   Influenza B by PCR NEGATIVE NEGATIVE Final    Comment: (NOTE) The Xpert Xpress SARS-CoV-2/FLU/RSV plus assay is intended as an aid in the diagnosis of influenza from Nasopharyngeal swab specimens and should not be used as a sole basis for treatment. Nasal washings and aspirates are unacceptable for Xpert Xpress SARS-CoV-2/FLU/RSV testing.  Fact Sheet for Patients: BloggerCourse.com  Fact Sheet for Healthcare Providers: SeriousBroker.it  This test is not yet approved or cleared by the [place] FDA and has been authorized for detection and/or diagnosis of SARS-CoV-2 by FDA under an Emergency Use Authorization (EUA). This EUA will remain in effect (meaning this test can be used) for the duration of the COVID-19 declaration under Section 564(b)(1) of the Act, 21 U.S.C. section 360bbb-3(b)(1), unless the authorization is terminated or revoked.  Performed at [facility], [address] [place], [place] [address]          Radiology Studies: DG Lumbar Spine 2-3 Views  Result Date: 05/05/2021 CLINICAL DATA:  Low back pain EXAM: LUMBAR SPINE - 2-3 VIEW COMPARISON:  12/19/2018 report FINDINGS: Mild dextrocurvature. Five non rib-bearing lumbar type vertebra. Lumbar sagittal alignment within normal limits. Vertebral body heights are maintained. Multilevel degenerative change with moderate disc space narrowing at L4-L5 and advanced degenerative changes at L5-S1. Facet degenerative changes of the lower  lumbar spine. Mild degenerative changes elsewhere in the spine. Possible punctate left kidney stones. IMPRESSION: Multilevel degenerative changes most advanced at L5-S1. Electronically Signed   By: Jasmine Pang M.D.   On: 05/05/2021 20:38   DG Shoulder Right  Result Date: 05/05/2021 CLINICAL DATA:  Right shoulder pain EXAM: RIGHT SHOULDER - 2+ VIEW COMPARISON:  None. FINDINGS: Old fracture deformity of the right clavicle. No acute displaced fracture or malalignment. The right lung apex is clear IMPRESSION: No acute osseous abnormality.  Old right  clavicle fracture Electronically Signed   By: Jasmine Pang M.D.   On: 05/05/2021 20:39   MR Lumbar Spine W Wo Contrast  Result Date: 05/06/2021 CLINICAL DATA:  Low back pain, > 6 wks, r/o nerve root compression EXAM: MRI LUMBAR SPINE WITHOUT AND WITH CONTRAST TECHNIQUE: Multiplanar and multiecho pulse sequences of the lumbar spine were obtained without and with intravenous contrast. CONTRAST:  85mL GADAVIST GADOBUTROL 1 MMOL/ML IV SOLN COMPARISON:  None. FINDINGS: Segmentation:  Standard. Alignment:  Multilevel trace retrolisthesis.  Mild dextrocurvature. Vertebrae: Vertebral body heights are maintained apart from degenerative endplate irregularity at L5-S1. Degenerative endplate marrow changes are present at this level including mild edema. Conus medullaris and cauda equina: Conus extends to the T12-L1 level. Conus and cauda equina appear normal. No abnormal intrathecal enhancement. Paraspinal and other soft tissues: Unremarkable. Disc levels: L1-L2: Minimal disc bulge with trace left central extrusion extending slightly above disc level. No canal or foraminal stenosis. L2-L3: Disc bulge slightly eccentric to the right. No canal or foraminal stenosis. L3-L4: Disc bulge with prominent foraminal components bilaterally. No canal stenosis. Slight effacement of the left subarticular recess. Mild right and mild to moderate left foraminal stenosis. Disc abuts and may  compress the exiting left L3 nerve root. L4-L5: Disc bulge with prominent foraminal components bilaterally. Facet arthropathy. No canal stenosis. Slight effacement of subarticular recesses. Moderate foraminal stenosis. L5-S1: Disc bulge with endplate osteophytic ridging. Facet arthropathy. No canal stenosis. Moderate to marked foraminal stenosis. Disc/osteophyte abuts and may compress the exiting right greater than left L5 nerve roots. IMPRESSION: Multilevel degenerative changes as detailed above. No high-grade canal stenosis. Foraminal narrowing is greatest at L5-S1. Potential exiting nerve root compression on the left at L3-L4 and bilaterally at L5-S1. Electronically Signed   By: Guadlupe Spanish M.D.   On: 05/06/2021 12:42        Scheduled Meds:  amoxicillin  875 mg Oral BID   chlordiazePOXIDE  60 mg Oral TID   ferrous sulfate  325 mg Oral QODAY   gabapentin  100 mg Oral TID   gabapentin  600 mg Oral TID   hydrochlorothiazide  12.5 mg Oral Daily   hydrocortisone  25 mg Rectal BID   lisinopril  40 mg Oral Daily   magnesium oxide  400 mg Oral BID   nicotine  21 mg Transdermal Daily   pantoprazole  40 mg Oral BID   thiamine  100 mg Oral Daily   Or   thiamine  100 mg Intravenous Daily   Continuous Infusions:   LOS: 4 days   The patient is critically ill with multiple organ systems failure and requires high complexity decision making for assessment and support, frequent evaluation and titration of therapies, application of advanced monitoring technologies and extensive interpretation of multiple databases. Critical Care Time devoted to patient care services described in this note  Time spent: 40 minutes     Brittie Whisnant, Roselind Messier, MD [facility]   If 7PM-7AM, please contact night-coverage 05/07/2021, 8:28 AM

## 2021-05-07 DIAGNOSIS — M5416 Radiculopathy, lumbar region: Secondary | ICD-10-CM | POA: Diagnosis present

## 2021-05-07 DIAGNOSIS — S42001A Fracture of unspecified part of right clavicle, initial encounter for closed fracture: Secondary | ICD-10-CM | POA: Diagnosis present

## 2021-05-07 DIAGNOSIS — G8929 Other chronic pain: Secondary | ICD-10-CM | POA: Diagnosis present

## 2021-05-07 LAB — COMPREHENSIVE METABOLIC PANEL
ALT: 49 U/L — ABNORMAL HIGH (ref 0–44)
AST: 50 U/L — ABNORMAL HIGH (ref 15–41)
Albumin: 3.5 g/dL (ref 3.5–5.0)
Alkaline Phosphatase: 91 U/L (ref 38–126)
Anion gap: 6 (ref 5–15)
BUN: 10 mg/dL (ref 6–20)
CO2: 27 mmol/L (ref 22–32)
Calcium: 9.3 mg/dL (ref 8.9–10.3)
Chloride: 103 mmol/L (ref 98–111)
Creatinine, Ser: 1.01 mg/dL (ref 0.61–1.24)
GFR, Estimated: >60 mL/min (ref >60)
Glucose, Bld: 102 mg/dL — ABNORMAL HIGH (ref 70–99)
Potassium: 3.5 mmol/L (ref 3.5–5.1)
Sodium: 136 mmol/L (ref 135–145)
Total Bilirubin: 0.5 mg/dL (ref 0.3–1.2)
Total Protein: 7 g/dL (ref 6.5–8.1)

## 2021-05-07 LAB — CBC WITH DIFFERENTIAL/PLATELET
Abs Immature Granulocytes: 0.05 10*3/uL (ref 0.00–0.07)
Basophils Absolute: 0 10*3/uL (ref 0.0–0.1)
Basophils Relative: 1 %
Eosinophils Absolute: 0.2 10*3/uL (ref 0.0–0.5)
Eosinophils Relative: 3 %
HCT: 30.9 % — ABNORMAL LOW (ref 39.0–52.0)
Hemoglobin: 10.1 g/dL — ABNORMAL LOW (ref 13.0–17.0)
Immature Granulocytes: 1 %
Lymphocytes Relative: 21 %
Lymphs Abs: 1.2 10*3/uL (ref 0.7–4.0)
MCH: 28.7 pg (ref 26.0–34.0)
MCHC: 32.7 g/dL (ref 30.0–36.0)
MCV: 87.8 fL (ref 80.0–100.0)
Monocytes Absolute: 1 10*3/uL (ref 0.1–1.0)
Monocytes Relative: 18 %
Neutro Abs: 3.2 10*3/uL (ref 1.7–7.7)
Neutrophils Relative %: 56 %
Platelets: 202 10*3/uL (ref 150–400)
RBC: 3.52 MIL/uL — ABNORMAL LOW (ref 4.22–5.81)
RDW: 16.6 % — ABNORMAL HIGH (ref 11.5–15.5)
WBC: 5.7 10*3/uL (ref 4.0–10.5)
nRBC: 0 % (ref 0.0–0.2)

## 2021-05-07 LAB — PHOSPHORUS: Phosphorus: 4.6 mg/dL (ref 2.5–4.6)

## 2021-05-07 LAB — MAGNESIUM: Magnesium: 2 mg/dL (ref 1.7–2.4)

## 2021-05-07 LAB — LIPASE, BLOOD: Lipase: 95 U/L — ABNORMAL HIGH (ref 11–51)

## 2021-05-07 NOTE — Evaluation (Signed)
Physical Therapy Evaluation Patient Details Name: Ronald Watts MRN: [id number] DOB: 06-Nov-1965 Today's Date: 05/07/2021  History of Present Illness  55 y.o. 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   Clinical Impression  Pt received sitting EOB with daughter in room, agreeable to therapy and stating he has not "moved around" in 5 days. RN did state pt is reporting he had fallen in the bathroom this admission - fall unwitnessed. Baseline: pt lives with wife (who works during the day), does not use AD and is typically independent however has progressively required more assist with IADLs over the past month and has a sister and daughter in the area who can assist at d/c as needed. 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   Pt reporting 9/10 pain at rest. Impaired strength, RLE weaker than LLE. Pt performed STS x 3 reps with CGA to steady. Due to pain, pt demo decreased WB through RLE with all standing activities. He ambulated 140ft>100ft>40ft assessing different AD. Evaluated amb (1) w/o AD - very unsteady, occasional R knee buckle, decreased WB through RLE - 120ft. (2) w/ SPC per pt request - still unsteady, difficulty coordinating sequencing resulting in decreased gait velocity and step-to pattern - 155ft. (3) w/ RW - improved safety, increased WB through BUE to decrease WB through RLE - 39ft. Rec pt use RW at this time; may include gait training with [facility] however rec with PT only. PT rec pt d/c home with HHPT and caregiver support 24/7. Pt and daughter agreeable. Pt would benefit from skilled PT to improve gait mechanics, R sided weakness and overall standing stability for improved QOL and decreased fall risk.       Recommendations for follow up therapy are one component of a multi-disciplinary discharge planning process, led by the attending physician.  Recommendations may be updated based on patient status, additional functional criteria and insurance authorization.  Follow Up Recommendations Home health PT    Assistance Recommended at Discharge Frequent or constant Supervision/Assistance  Functional Status Assessment Patient has had a recent decline in their functional status and demonstrates the ability to make significant improvements in function in a reasonable and predictable amount of time.  Equipment Recommendations  None recommended by PT    Recommendations for Other Services       Precautions / Restrictions Precautions Precautions: Fall Restrictions Weight Bearing Restrictions: No      Mobility  Bed Mobility               General bed mobility comments: NT - pt sitting on EOB at beg and end of session    Transfers Overall transfer level: Needs assistance Equipment used: None;Straight cane;Rolling walker (2 wheels) Transfers: Sit to/from Stand Sit to Stand: Min guard           General transfer comment: CGA to steady and for safety. 3 reps - w/o AD, w/ SPC, w/ RW. Safest with RW due to instability on feet. VC on hand placement.    Ambulation/Gait Ambulation/Gait assistance: Min assist Gait Distance (Feet): 100 Feet Assistive device: None Gait Pattern/deviations: Step-through pattern;Decreased stance time - right;Decreased step length - left Gait velocity: decreased   General Gait Details: Evaluated amb (1) w/o AD - very unsteady, occasional R knee buckle, decreased WB through  RLE - 161ft. (2) w/ SPC per pt request - still unsteady, difficulty coordinating sequencing resulting in decreased gait velocity and step-to pattern - 153ft. (3) w/ RW - improved safety, increased WB through BUE to decrease WB through RLE - 26ft. Pt reporting pain with ambulation.  Stairs             Wheelchair Mobility    Modified Rankin (Stroke Patients Only)       Balance Overall balance assessment: Needs assistance Sitting-balance support: No upper extremity supported;Feet supported Sitting balance-Leahy Scale: Good     Standing balance support: No upper extremity supported Standing balance-Leahy Scale: Poor Standing balance comment: Required MIN A to steady during ambulation without AD. Improved to CGA with BUE support of RW.                             Pertinent Vitals/Pain Pain Assessment: 0-10 Pain Score: 9  Pain Location: lumbar Pain Descriptors / Indicators: Guarding;Shooting Pain Intervention(s): Limited activity within patient's tolerance;Monitored during session    Home Living Family/patient expects to be discharged to:: Private residence Living Arrangements: Spouse/significant other Available Help at Discharge: Family;Available PRN/intermittently;Available 24 hours/day Type of Home: House Home Access: Stairs to enter Entrance Stairs-Rails: None Entrance Stairs-Number of Steps: 1   Home Layout: One level Home Equipment: Rollator (4 wheels);Cane - single point;Shower seat Additional Comments: Does not use AD at baseline. Atempted to teach himself how to use a SPC, had trouble with sequencing. Also owns a shower chair but does not use it. Does not drive due to blindness. Pt daughter and sister live nearby and can help as needed at d/c while pt wife is at work.    Prior Function Prior Level of Function : Independent/Modified Independent;Needs assist;History of Falls (last six months)       Physical Assist : ADLs (physical)   ADLs (physical): IADLs   ADLs Comments: States he is typically independent however has required increased assist over the past month.     Hand Dominance   Dominant Hand: Right    Extremity/Trunk Assessment   Upper Extremity Assessment Upper Extremity Assessment: Generalized weakness    Lower  Extremity Assessment Lower Extremity Assessment: Generalized weakness;RLE deficits/detail (Decreased sensation to ligth touch in BLE, R worse than L.) RLE Deficits / Details: related to stroke vs sciatic nerve - 4-/5 hip flexion, knee extension, DF. (LLE: 4+-5/5 globally)    Cervical / Trunk Assessment Cervical / Trunk Assessment: Kyphotic  Communication   Communication: No difficulties  Cognition Arousal/Alertness: Awake/alert Behavior During Therapy: WFL for tasks assessed/performed Overall Cognitive Status: Within Functional Limits for tasks assessed                                 General Comments: A&Ox4        General Comments      Exercises Other Exercises Other Exercises: Education on [facility] sequencing with multiple demos and constant VC. Also educated on importance of mobility as well as safety, encouraged to walk with nursing staff in the hallways and to always call staff for out of bed mobility including to the bathroom. PT encouraging [place] of RW at this time. RN aware.   Assessment/Plan    PT Assessment Patient needs continued PT services  PT Problem List Decreased strength;Decreased mobility;Decreased safety awareness;Decreased coordination;Decreased activity tolerance;Decreased balance       PT Treatment Interventions Gait  training;Stair training;Functional mobility training;Therapeutic activities;Therapeutic exercise;Balance training;Neuromuscular re-education;Manual techniques;Patient/family education    PT Goals (Current goals can be found in the Care Plan section)  Acute Rehab PT Goals Patient Stated Goal: to get stronger PT Goal Formulation: With patient Time For Goal Achievement: 05/21/21 Potential to Achieve Goals: Good    Frequency Min 2X/week   Barriers to discharge        Co-evaluation               AM-PAC PT "6 Clicks" Mobility  Outcome Measure Help needed turning from your back to your side while in a flat bed without using  bedrails?: None Help needed moving from lying on your back to sitting on the side of a flat bed without using bedrails?: A Little Help needed moving to and from a bed to a chair (including a wheelchair)?: A Little Help needed standing up from a chair using your arms (e.g., wheelchair or bedside chair)?: A Little Help needed to walk in hospital room?: A Little Help needed climbing 3-5 steps with a railing? : A Little 6 Click Score: 19    End of Session Equipment Utilized During Treatment: Gait belt Activity Tolerance: Patient tolerated treatment well;Patient limited by pain Patient left: in bed;with call bell/phone within reach;with family/visitor present;with SCD's reapplied Nurse Communication: Mobility status;Precautions PT Visit Diagnosis: Unsteadiness on feet (R26.81);Other abnormalities of gait and mobility (R26.89);Muscle weakness (generalized) (M62.81);History of falling (Z91.81);Difficulty in walking, not elsewhere classified (R26.2);Pain Pain - Right/Left: Right Pain - part of body: Leg    Time: 0623-7628 PT Time Calculation (min) (ACUTE ONLY): 46 min   Charges:   PT Evaluation $PT Eval Moderate Complexity: 1 Mod PT Treatments $Gait Training: 23-37 mins $Therapeutic Activity: 8-22 mins       Basilia Jumbo PT, DPT 05/07/21 3:10 PM [phone]

## 2021-05-07 NOTE — Progress Notes (Signed)
PROGRESS NOTE    Ronald Watts  ZOX:[id number] DOB: April 12, 1966 DOA: 05/02/2021 PCP: Leanna Sato, MD   Brief Narrative:  55 y.o. 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  He also reported nausea, vomiting and abdominal pain.     In the ED, labs showed Hbg 11.3, elevated lipase 189, and elevated LFT's.  Hemodynamically stable.  Admitted to [facility] service with GI consulted for further evaluation and management of GI bleeding and acute pancreatitis.   Subjective: 10/28 A/O x4, continued back pain.    Assessment & Plan:  Covid vaccination;  Principal Problem:   Rectal bleeding Active Problems:   Hypertension   HLD (hyperlipidemia)   GERD (gastroesophageal reflux disease)   COPD (chronic obstructive pulmonary disease) (HCC)   Anemia due to blood loss   Stroke [facility] [address])   Alcohol abuse   Tobacco abuse   Alcoholic pancreatitis   Abnormal LFTs   Dental infection   Acute pancreatitis   Lumbar nerve root impingement   Right clavicle fracture   Rectal bleeding with mild blood loss anemia (admission hemoglobin 11.3) -likely due to hemorrhoids.   -Anemia panel unremarkable, only mildly reduced sat ratio. -Showed normal iron, ferritin, TIBC, vitamin B12 and folate. -11/28 resolved --GI consulted, signed off --Outpatient EGD and colonoscopy -- Transfuse for hemoglobin <7  --Avoid NSAIDs and anticoagulants -- Resume oral PPI daily --Anusol suppositories twice daily -- Oral iron supplement every other day per GI Lab Results  Component Value Date   HGB 10.0 (L) 05/08/2021   HGB 10.1 (L) 05/07/2021   HGB 10.7 (L) 05/06/2021   HGB 11.0 (L) 05/05/2021   HGB 9.7 (L) 05/04/2021     Acute pancreatitis  -likely due to alcohol abuse. -Abdominal pain, nausea vomiting have improved. -- Diet advanced and appears tolerating -- Stop IV fluids --Antiemetics and  pain control as needed -10/26 resolved Lab Results  Component Value Date   LIPASE 82 (H) 05/08/2021   LIPASE 95 (H) 05/07/2021   LIPASE 102 (H) 05/06/2021   LIPASE 189 (H) 05/02/2021     ETOH abuse with withdrawal syndrome, uncomplicated  - patient expresses desire to detox from alcohol.   He has mild tremors, reports intermittent mild hallucinations. --CIWA protocol with as needed Ativan --Recommend maintenance medication in the outpatient setting -specifically naltrexone or acamprosate.  Would not dispense medication at discharge.  Would allow EtOH program to dispense medication in order to monitor for any side effects -- 10/26 TOC consulted to provide local resources for assistance   Transaminitis  -most likely due to alcohol abuse.  LFTs improving. Acute hepatitis panel and HIV screen negative.     Dental infection  -no fever or leukocytosis.  No signs of systemic infection or sepsis. --Continue amoxicillin prescribed as outpatient   HTN -continue home medication and as needed hydralazine  GERD  -currently on IV PPI, transition back to home oral 10/27 if tolerating p.o. intake well   COPD/Tobacco abuse  -stable without signs of acute exacerbation. --Bronchodilators -Patient and wife counseled extensively on need to discontinue smoking. -Nicotine patch   Hx stroke -not currently on home medications.   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   -No acute issues or neurologic symptoms. --PCP follow-up  DJD L-spine - 10/27 advanced degenerative changes L5-S1 see results below.  Consistent with patient's pain and sciatica.   -10/27 MRI L-spine shows possible nerve root  compression see results below.  Will consult neurosurgeon; amenable to surgical correction?   -10/28 discussed case with Neurosurgeon Dr. Marikay Alar and he concurs that there is no surgical intervention needed at this point.  Recommends PT and referral to pain management center.  RIGHT clavicle  fracture POA - Old right clavicle fracture see x-ray results below  Bilateral lower extremity PVD - 9/23 ABI performed by Dr. Dorothyann Peng at [facility]; Normal ABIs no evidence of significant peripheral disease   Patient BMI: Body mass index is 20.81 kg/m.      DVT prophylaxis: SCD Code Status: Full Family Communication: 10/26 wife bedside for discussion of plan of care all questions answered Status is: Inpatient    Dispo: The patient is from: Home              Anticipated d/c is to: SNF              Anticipated d/c date is: 2 days              Patient currently is not medically stable to d/c.      Consultants:  GI DO Nanetta Batty Neurosurgeon Dr. Marikay Alar   Procedures/Significant Events:  Abdominal ultrasound showed:  1. Relative hypoechogenicity of the pancreatic parenchyma suggests pancreatic edema which would be consistent with the clinical history of pancreatitis. No peripancreatic fluid or pseudocyst identified. 2. Hepatic steatosis   Liver Doppler ultrasound showed: 1. Widely patent portal veins with normal hepatopetal directional flow. 2. Normally patent hepatic veins. 3. Echogenic and coarsened hepatic parenchyma suggests underlying steatosis 10/26 DG L spine:-Multilevel degenerative change with moderate disc space narrowing at L4-L5  -Advanced degenerative changes at L5-S1. Facet degenerative changes of the lower lumbar spine.  -Mild degenerative changes elsewhere in the spine.  -Possible punctate left kidney stones. 10/26 DG RIGHT shoulder:No acute osseous abnormality.  Old right clavicle fracture 10/27 MRI L-spine W/W. Wo contrast:No high-grade canal stenosis. Foraminal narrowing is greatest at L5-S1. Potential exiting nerve root compression on the left at L3-L4 and bilaterally at L5-S1.   I have personally reviewed and interpreted all radiology studies and my findings are as above.  VENTILATOR  SETTINGS:    Cultures   Antimicrobials: Anti-infectives (From admission, onward)    Start     Ordered Stop   05/02/21 2000  amoxicillin (AMOXIL) 250 MG/5ML suspension 875 mg        05/02/21 1439 05/08/21 2359         Devices    LINES / TUBES:      Continuous Infusions:   Objective: Vitals:   05/06/21 1109 05/06/21 1615 05/06/21 2032 05/07/21 0336  BP: (!) 141/101 (!) 136/112 120/90 (!) 128/112  Pulse: 100 (!) 103 92 (!) 101  Resp: 18 18 16 16   Temp: 98.8 F (37.1 C) 98.9 F (37.2 C) (!) 97.5 F (36.4 C) 97.8 F (36.6 C)  TempSrc:  Oral Oral   SpO2: 99% 99% 97% 98%  Weight:      Height:        Intake/Output Summary (Last 24 hours) at 05/07/2021 05/09/2021 Last data filed at 05/07/2021 0341 Gross per 24 hour  Intake 834 ml  Output --  Net 834 ml    Filed Weights   05/02/21 0726  Weight: 65.8 kg    Examination:  General: A/O x4 No acute respiratory distress, cachectic Eyes: negative scleral hemorrhage, negative anisocoria, negative icterus ENT: Negative Runny nose, negative gingival bleeding, Neck:  Negative scars, masses, torticollis, lymphadenopathy,  JVD Lungs: Clear to auscultation bilaterally without wheezes or crackles Cardiovascular: Regular rate and rhythm without murmur gallop or rub normal S1 and S2 Abdomen: negative abdominal pain, nondistended, positive soft, bowel sounds, no rebound, no ascites, no appreciable mass Extremities: pain to palpation RIGHT shoulder Skin: Negative rashes, lesions, ulcers Musculoskeletal: Pain to palpation midline L1-S2. Psychiatric:  Negative depression, positive anxiety, negative fatigue, negative mania  Central nervous system:  Cranial nerves II through XII intact, tongue/uvula midline, all extremities muscle strength 5/5, except for RIGHT lower extremity strength 4/5 with straight leg raise and dorsiflexion of foot.  In addition pain in back with RIGHT straight leg movement.  Sensation intact throughout,  negative dysarthria, negative expressive aphasia, negative receptive aphasia.  .     Data Reviewed: Care during the described time interval was provided by me .  I have reviewed this patient's available data, including medical history, events of note, physical examination, and all test results as part of my evaluation.   CBC: Recent Labs  Lab 05/03/21 0447 05/04/21 0544 05/05/21 0406 05/06/21 0601 05/07/21 0635  WBC 3.2* 3.8* 5.2 4.9 5.7  NEUTROABS  --   --   --  3.2 3.2  HGB 10.2* 9.7* 11.0* 10.7* 10.1*  HCT 30.4* 29.2* 32.4* 31.6* 30.9*  MCV 86.6 88.5 86.2 87.3 87.8  PLT 132* 138* 168 170 202    Basic Metabolic Panel: Recent Labs  Lab 05/02/21 2046 05/03/21 0447 05/04/21 0544 05/05/21 0406 05/06/21 0601 05/07/21 0635  NA  --  135 137 135 137 136  K  --  3.5 3.5 3.6 3.4* 3.5  CL  --  96* 106 104 103 103  CO2  --  25 24 23 24 27   GLUCOSE  --  96 103* 110* 103* 102*  BUN  --  5* 5* 7 12 10   CREATININE  --  0.87 0.83 0.84 0.94 1.01  CALCIUM  --  8.5* 9.0 9.6 9.7 9.3  MG 1.8  --  1.8  --  1.9 2.0  PHOS  --   --   --   --  5.4* 4.6    GFR: Estimated Creatinine Clearance: 77.8 mL/min (by C-G formula based on SCr of 1.01 mg/dL). Liver Function Tests: Recent Labs  Lab 05/02/21 0728 05/04/21 0544 05/05/21 0406 05/06/21 0601 05/07/21 0635  AST 182* 79* 75* 57* 50*  ALT 79* 52* 59* 54* 49*  ALKPHOS 110 90 108 100 91  BILITOT 0.8 0.7 0.5 0.5 0.5  PROT 7.6 6.4* 7.1 7.0 7.0  ALBUMIN 3.8 3.3* 3.7 3.5 3.5    Recent Labs  Lab 05/02/21 0728 05/06/21 0601 05/07/21 0635  LIPASE 189* 102* 95*    Recent Labs  Lab 05/03/21 1025  AMMONIA 18    Coagulation Profile: Recent Labs  Lab 05/02/21 1409  INR 0.9    Cardiac Enzymes: No results for input(s): CKTOTAL, CKMB, CKMBINDEX, TROPONINI in the last 168 hours. BNP (last 3 results) No results for input(s): PROBNP in the last 8760 hours. HbA1C: No results for input(s): HGBA1C in the last 72  hours. CBG: Recent Labs  Lab 05/03/21 0814 05/04/21 0726 05/04/21 0740 05/04/21 1140  GLUCAP 94 109* 95 107*    Lipid Profile: No results for input(s): CHOL, HDL, LDLCALC, TRIG, CHOLHDL, LDLDIRECT in the last 72 hours.  Thyroid Function Tests: No results for input(s): TSH, T4TOTAL, FREET4, T3FREE, THYROIDAB in the last 72 hours. Anemia Panel: No results for input(s): VITAMINB12, FOLATE, FERRITIN, TIBC, IRON, RETICCTPCT in the last  72 hours.  Urine analysis:    Component Value Date/Time   COLORURINE YELLOW (A) 05/02/2021 1303   APPEARANCEUR CLEAR (A) 05/02/2021 1303   LABSPEC 1.008 05/02/2021 1303   PHURINE 7.0 05/02/2021 1303   GLUCOSEU NEGATIVE 05/02/2021 1303   HGBUR NEGATIVE 05/02/2021 1303   BILIRUBINUR NEGATIVE 05/02/2021 1303   KETONESUR NEGATIVE 05/02/2021 1303   PROTEINUR NEGATIVE 05/02/2021 1303   NITRITE NEGATIVE 05/02/2021 1303   LEUKOCYTESUR NEGATIVE 05/02/2021 1303   Sepsis Labs: @LABRCNTIP (procalcitonin:4,lacticidven:4)  ) Recent Results (from the past 240 hour(s))  Resp Panel by RT-PCR (Flu A&B, Covid) Nasopharyngeal Swab     Status: None   Collection Time: 05/02/21 10:13 AM   Specimen: Nasopharyngeal Swab; Nasopharyngeal(NP) swabs in vial transport medium  Result Value Ref Range Status   SARS Coronavirus 2 by RT PCR NEGATIVE NEGATIVE Final    Comment: (NOTE) SARS-CoV-2 target nucleic acids are NOT DETECTED.  The SARS-CoV-2 RNA is generally detectable in upper respiratory specimens during the acute phase of infection. The lowest concentration of SARS-CoV-2 viral copies this assay can detect is 138 copies/mL. A negative result does not preclude SARS-Cov-2 infection and should not be used as the sole basis for treatment or other patient management decisions. A negative result may occur with  improper specimen collection/handling, submission of specimen other than nasopharyngeal swab, presence of viral mutation(s) within the areas targeted by this  assay, and inadequate number of viral copies(<138 copies/mL). A negative result must be combined with clinical observations, patient history, and epidemiological information. The expected result is Negative.  Fact Sheet for Patients:  BloggerCourse.com  Fact Sheet for Healthcare Providers:  SeriousBroker.it  This test is no t yet approved or cleared by the [place] FDA and  has been authorized for detection and/or diagnosis of SARS-CoV-2 by FDA under an Emergency Use Authorization (EUA). This EUA will remain  in effect (meaning this test can be used) for the duration of the COVID-19 declaration under Section 564(b)(1) of the Act, 21 U.S.C.section 360bbb-3(b)(1), unless the authorization is terminated  or revoked sooner.       Influenza A by PCR NEGATIVE NEGATIVE Final   Influenza B by PCR NEGATIVE NEGATIVE Final    Comment: (NOTE) The Xpert Xpress SARS-CoV-2/FLU/RSV plus assay is intended as an aid in the diagnosis of influenza from Nasopharyngeal swab specimens and should not be used as a sole basis for treatment. Nasal washings and aspirates are unacceptable for Xpert Xpress SARS-CoV-2/FLU/RSV testing.  Fact Sheet for Patients: BloggerCourse.com  Fact Sheet for Healthcare Providers: SeriousBroker.it  This test is not yet approved or cleared by the [place] FDA and has been authorized for detection and/or diagnosis of SARS-CoV-2 by FDA under an Emergency Use Authorization (EUA). This EUA will remain in effect (meaning this test can be used) for the duration of the COVID-19 declaration under Section 564(b)(1) of the Act, 21 U.S.C. section 360bbb-3(b)(1), unless the authorization is terminated or revoked.  Performed at [facility], [address] [place], [place] [address]          Radiology Studies: DG Lumbar Spine 2-3 Views  Result Date:  05/05/2021 CLINICAL DATA:  Low back pain EXAM: LUMBAR SPINE - 2-3 VIEW COMPARISON:  12/19/2018 report FINDINGS: Mild dextrocurvature. Five non rib-bearing lumbar type vertebra. Lumbar sagittal alignment within normal limits. Vertebral body heights are maintained. Multilevel degenerative change with moderate disc space narrowing at L4-L5 and advanced degenerative changes at L5-S1. Facet degenerative changes of the lower lumbar spine. Mild degenerative changes  elsewhere in the spine. Possible punctate left kidney stones. IMPRESSION: Multilevel degenerative changes most advanced at L5-S1. Electronically Signed   By: Jasmine Pang M.D.   On: 05/05/2021 20:38   DG Shoulder Right  Result Date: 05/05/2021 CLINICAL DATA:  Right shoulder pain EXAM: RIGHT SHOULDER - 2+ VIEW COMPARISON:  None. FINDINGS: Old fracture deformity of the right clavicle. No acute displaced fracture or malalignment. The right lung apex is clear IMPRESSION: No acute osseous abnormality.  Old right clavicle fracture Electronically Signed   By: Jasmine Pang M.D.   On: 05/05/2021 20:39   MR Lumbar Spine W Wo Contrast  Result Date: 05/06/2021 CLINICAL DATA:  Low back pain, > 6 wks, r/o nerve root compression EXAM: MRI LUMBAR SPINE WITHOUT AND WITH CONTRAST TECHNIQUE: Multiplanar and multiecho pulse sequences of the lumbar spine were obtained without and with intravenous contrast. CONTRAST:  35mL GADAVIST GADOBUTROL 1 MMOL/ML IV SOLN COMPARISON:  None. FINDINGS: Segmentation:  Standard. Alignment:  Multilevel trace retrolisthesis.  Mild dextrocurvature. Vertebrae: Vertebral body heights are maintained apart from degenerative endplate irregularity at L5-S1. Degenerative endplate marrow changes are present at this level including mild edema. Conus medullaris and cauda equina: Conus extends to the T12-L1 level. Conus and cauda equina appear normal. No abnormal intrathecal enhancement. Paraspinal and other soft tissues: Unremarkable. Disc levels:  L1-L2: Minimal disc bulge with trace left central extrusion extending slightly above disc level. No canal or foraminal stenosis. L2-L3: Disc bulge slightly eccentric to the right. No canal or foraminal stenosis. L3-L4: Disc bulge with prominent foraminal components bilaterally. No canal stenosis. Slight effacement of the left subarticular recess. Mild right and mild to moderate left foraminal stenosis. Disc abuts and may compress the exiting left L3 nerve root. L4-L5: Disc bulge with prominent foraminal components bilaterally. Facet arthropathy. No canal stenosis. Slight effacement of subarticular recesses. Moderate foraminal stenosis. L5-S1: Disc bulge with endplate osteophytic ridging. Facet arthropathy. No canal stenosis. Moderate to marked foraminal stenosis. Disc/osteophyte abuts and may compress the exiting right greater than left L5 nerve roots. IMPRESSION: Multilevel degenerative changes as detailed above. No high-grade canal stenosis. Foraminal narrowing is greatest at L5-S1. Potential exiting nerve root compression on the left at L3-L4 and bilaterally at L5-S1. Electronically Signed   By: Guadlupe Spanish M.D.   On: 05/06/2021 12:42        Scheduled Meds:  amoxicillin  875 mg Oral BID   chlordiazePOXIDE  60 mg Oral TID   ferrous sulfate  325 mg Oral QODAY   gabapentin  100 mg Oral TID   gabapentin  600 mg Oral TID   hydrochlorothiazide  12.5 mg Oral Daily   hydrocortisone  25 mg Rectal BID   lisinopril  40 mg Oral Daily   magnesium oxide  400 mg Oral BID   nicotine  21 mg Transdermal Daily   pantoprazole  40 mg Oral BID   thiamine  100 mg Oral Daily   Or   thiamine  100 mg Intravenous Daily   Continuous Infusions:   LOS: 4 days   The patient is critically ill with multiple organ systems failure and requires high complexity decision making for assessment and support, frequent evaluation and titration of therapies, application of advanced monitoring technologies and extensive  interpretation of multiple databases. Critical Care Time devoted to patient care services described in this note  Time spent: 40 minutes     Jessicca Stitzer, Roselind Messier, MD [facility]   If 7PM-7AM, please contact night-coverage 05/07/2021, 8:29 AM

## 2021-05-08 LAB — CBC WITH DIFFERENTIAL/PLATELET
Abs Immature Granulocytes: 0.07 10*3/uL (ref 0.00–0.07)
Basophils Absolute: 0 10*3/uL (ref 0.0–0.1)
Basophils Relative: 1 %
Eosinophils Absolute: 0.2 10*3/uL (ref 0.0–0.5)
Eosinophils Relative: 3 %
HCT: 31.1 % — ABNORMAL LOW (ref 39.0–52.0)
Hemoglobin: 10 g/dL — ABNORMAL LOW (ref 13.0–17.0)
Immature Granulocytes: 1 %
Lymphocytes Relative: 24 %
Lymphs Abs: 1.3 10*3/uL (ref 0.7–4.0)
MCH: 28.5 pg (ref 26.0–34.0)
MCHC: 32.2 g/dL (ref 30.0–36.0)
MCV: 88.6 fL (ref 80.0–100.0)
Monocytes Absolute: 1.2 10*3/uL — ABNORMAL HIGH (ref 0.1–1.0)
Monocytes Relative: 22 %
Neutro Abs: 2.6 10*3/uL (ref 1.7–7.7)
Neutrophils Relative %: 49 %
Platelets: 238 10*3/uL (ref 150–400)
RBC: 3.51 MIL/uL — ABNORMAL LOW (ref 4.22–5.81)
RDW: 16.7 % — ABNORMAL HIGH (ref 11.5–15.5)
WBC: 5.3 10*3/uL (ref 4.0–10.5)
nRBC: 0 % (ref 0.0–0.2)

## 2021-05-08 LAB — COMPREHENSIVE METABOLIC PANEL
ALT: 44 U/L (ref 0–44)
AST: 37 U/L (ref 15–41)
Albumin: 3.4 g/dL — ABNORMAL LOW (ref 3.5–5.0)
Alkaline Phosphatase: 83 U/L (ref 38–126)
Anion gap: 7 (ref 5–15)
BUN: 14 mg/dL (ref 6–20)
CO2: 26 mmol/L (ref 22–32)
Calcium: 9.4 mg/dL (ref 8.9–10.3)
Chloride: 104 mmol/L (ref 98–111)
Creatinine, Ser: 1.09 mg/dL (ref 0.61–1.24)
GFR, Estimated: >60 mL/min (ref >60)
Glucose, Bld: 96 mg/dL (ref 70–99)
Potassium: 3.7 mmol/L (ref 3.5–5.1)
Sodium: 137 mmol/L (ref 135–145)
Total Bilirubin: 0.4 mg/dL (ref 0.3–1.2)
Total Protein: 6.7 g/dL (ref 6.5–8.1)

## 2021-05-08 LAB — MAGNESIUM: Magnesium: 1.9 mg/dL (ref 1.7–2.4)

## 2021-05-08 LAB — LIPASE, BLOOD: Lipase: 82 U/L — ABNORMAL HIGH (ref 11–51)

## 2021-05-08 LAB — PHOSPHORUS: Phosphorus: 4.5 mg/dL (ref 2.5–4.6)

## 2021-05-08 MED ORDER — ASCORBIC ACID 500 MG PO TABS
250.0000 mg | ORAL_TABLET | Freq: Every day | ORAL | Status: DC
  Administered 2021-05-08 – 2021-05-09 (×2): 250 mg via ORAL
  Filled 2021-05-08 (×2): qty 1

## 2021-05-08 NOTE — Progress Notes (Signed)
PROGRESS NOTE    Ronald Watts  FKC:[id number] DOB: 03-15-1966 DOA: 05/02/2021 PCP: Leanna Sato, MD   Brief Narrative:  55 y.o. 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  He also reported nausea, vomiting and abdominal pain.     In the ED, labs showed Hbg 11.3, elevated lipase 189, and elevated LFT's.  Hemodynamically stable.  Admitted to [facility] service with GI consulted for further evaluation and management of GI bleeding and acute pancreatitis.   Subjective: 10/29 A/O x4, continued back pain.  States he is open to going to outpatient PT but cannot afford any out-of-pocket costs.  Needs for his insurance to pay for 100% of treatment.   Assessment & Plan:  Covid vaccination;  Principal Problem:   Rectal bleeding Active Problems:   Hypertension   HLD (hyperlipidemia)   GERD (gastroesophageal reflux disease)   COPD (chronic obstructive pulmonary disease) (HCC)   Anemia due to blood loss   Stroke [facility])   Alcohol abuse   Tobacco abuse   Alcoholic pancreatitis   Abnormal LFTs   Dental infection   Acute pancreatitis   Lumbar nerve root impingement   Right clavicle fracture   Rectal bleeding with mild blood loss anemia (admission hemoglobin 11.3) -likely due to hemorrhoids.   -Anemia panel unremarkable, only mildly reduced sat ratio. -Showed normal iron, ferritin, TIBC, vitamin B12 and folate. -11/28 resolved --GI consulted, signed off --Outpatient EGD and colonoscopy -- Transfuse for hemoglobin <7  --Avoid NSAIDs and anticoagulants -- Resume oral PPI daily --Anusol suppositories twice daily -- Oral iron supplement every other day per GI -Vitamin C 250 mg daily Lab Results  Component Value Date   HGB 10.0 (L) 05/08/2021   HGB 10.1 (L) 05/07/2021   HGB 10.7 (L) 05/06/2021   HGB 11.0 (L) 05/05/2021   HGB 9.7 (L) 05/04/2021     Acute  pancreatitis  -likely due to alcohol abuse. -Abdominal pain, nausea vomiting have improved. -- Diet advanced and appears tolerating -- Stop IV fluids --Antiemetics and pain control as needed -10/26 resolved Lab Results  Component Value Date   LIPASE 82 (H) 05/08/2021   LIPASE 95 (H) 05/07/2021   LIPASE 102 (H) 05/06/2021   LIPASE 189 (H) 05/02/2021     ETOH abuse with withdrawal syndrome, uncomplicated  - patient expresses desire to detox from alcohol.   He has mild tremors, reports intermittent mild hallucinations. --CIWA protocol with as needed Ativan --Recommend maintenance medication in the outpatient setting -specifically naltrexone or acamprosate.  Would not dispense medication at discharge.  Would allow EtOH program to dispense medication in order to monitor for any side effects -- 10/26 TOC consulted to provide local resources for assistance   Transaminitis  -most likely due to alcohol abuse.  LFTs improving. -Acute hepatitis panel and HIV screen negative.     Dental infection  -no fever or leukocytosis.  No signs of systemic infection or sepsis. --Continue amoxicillin prescribed as outpatient   HTN -continue home medication and as needed hydralazine  GERD  -currently on IV PPI, transition back to home oral 10/27 if tolerating p.o. intake well   COPD/Tobacco abuse  -stable without signs of acute exacerbation. --Bronchodilators -Patient and wife counseled extensively on need to discontinue smoking. -Nicotine patch   Hx stroke -not currently on home medications.   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   -No acute issues or neurologic  symptoms. --PCP follow-up  DJD L-spine - 10/27 advanced degenerative changes L5-S1 see results below.  Consistent with patient's pain and sciatica.   -10/27 MRI L-spine shows possible nerve root compression see results below.  Will consult neurosurgeon; amenable to surgical correction?   -10/28 discussed case with  Neurosurgeon Dr. Marikay Alar and he concurs that there is no surgical intervention needed at this point.  Recommends PT and referral to pain management center.  RIGHT clavicle fracture POA - Old right clavicle fracture see x-ray results below  Bilateral lower extremity PVD - 9/23 ABI performed by Dr. Dorothyann Peng at [place] [facility]; Normal ABIs no evidence of significant peripheral disease -NEGATIVE PVD in bilateral lower extremity     patient BMI: Body mass index is 20.81 kg/m.      DVT prophylaxis: SCD Code Status: Full Family Communication: 10/26 wife bedside for discussion of plan of care all questions answered Status is: Inpatient    Dispo: The patient is from: Home              Anticipated d/c is to: SNF              Anticipated d/c date is: 2 days              Patient currently is not medically stable to d/c.      Consultants:  GI DO Nanetta Batty Neurosurgeon Dr. Marikay Alar   Procedures/Significant Events:  Abdominal ultrasound showed:  1. Relative hypoechogenicity of the pancreatic parenchyma suggests pancreatic edema which would be consistent with the clinical history of pancreatitis. No peripancreatic fluid or pseudocyst identified. 2. Hepatic steatosis   Liver Doppler ultrasound showed: 1. Widely patent portal veins with normal hepatopetal directional flow. 2. Normally patent hepatic veins. 3. Echogenic and coarsened hepatic parenchyma suggests underlying steatosis 10/26 DG L spine:-Multilevel degenerative change with moderate disc space narrowing at L4-L5  -Advanced degenerative changes at L5-S1. Facet degenerative changes of the lower lumbar spine.  -Mild degenerative changes elsewhere in the spine.  -Possible punctate left kidney stones. 10/26 DG RIGHT shoulder:No acute osseous abnormality.  Old right clavicle fracture 10/27 MRI L-spine W/W. Wo contrast:No high-grade canal stenosis. Foraminal narrowing is greatest at L5-S1. Potential exiting  nerve root compression on the left at L3-L4 and bilaterally at L5-S1.   I have personally reviewed and interpreted all radiology studies and my findings are as above.  VENTILATOR SETTINGS:    Cultures 10/24 acute hepatitis panel negative 10/24 HIV screen negative    Antimicrobials: Anti-infectives (From admission, onward)    Start     Ordered Stop   05/02/21 2000  amoxicillin (AMOXIL) 250 MG/5ML suspension 875 mg        05/02/21 1439 05/08/21 2359         Devices    LINES / TUBES:      Continuous Infusions:   Objective: Vitals:   05/07/21 1654 05/07/21 2104 05/07/21 2337 05/08/21 0623  BP: (!) 118/91 108/80 111/82 103/85  Pulse: 100 (!) 101 77 79  Resp: 16 16 16 16   Temp: 98.3 F (36.8 C) 97.6 F (36.4 C) 97.8 F (36.6 C) 97.7 F (36.5 C)  TempSrc: Oral Oral Oral Oral  SpO2: 98% 98% 98% 94%  Weight:      Height:       No intake or output data in the 24 hours ending 05/08/21 0729  Filed Weights   05/02/21 0726  Weight: 65.8 kg    Examination:  General: A/O x4 No acute  respiratory distress, cachectic Eyes: negative scleral hemorrhage, negative anisocoria, negative icterus ENT: Negative Runny nose, negative gingival bleeding, Neck:  Negative scars, masses, torticollis, lymphadenopathy, JVD Lungs: Clear to auscultation bilaterally without wheezes or crackles Cardiovascular: Regular rate and rhythm without murmur gallop or rub normal S1 and S2 Abdomen: negative abdominal pain, nondistended, positive soft, bowel sounds, no rebound, no ascites, no appreciable mass Extremities: pain to palpation RIGHT shoulder Skin: Negative rashes, lesions, ulcers Musculoskeletal: Pain to palpation midline L1-S2. Psychiatric:  Negative depression, positive anxiety, negative fatigue, negative mania  Central nervous system:  Cranial nerves II through XII intact, tongue/uvula midline, all extremities muscle strength 5/5, except for RIGHT lower extremity strength 4/5  with straight leg raise and dorsiflexion of foot.  In addition pain in back with RIGHT straight leg movement.  Sensation intact throughout, negative dysarthria, negative expressive aphasia, negative receptive aphasia.  .     Data Reviewed: Care during the described time interval was provided by me .  I have reviewed this patient's available data, including medical history, events of note, physical examination, and all test results as part of my evaluation.   CBC: Recent Labs  Lab 05/04/21 0544 05/05/21 0406 05/06/21 0601 05/07/21 0635 05/08/21 0612  WBC 3.8* 5.2 4.9 5.7 5.3  NEUTROABS  --   --  3.2 3.2 2.6  HGB 9.7* 11.0* 10.7* 10.1* 10.0*  HCT 29.2* 32.4* 31.6* 30.9* 31.1*  MCV 88.5 86.2 87.3 87.8 88.6  PLT 138* 168 170 202 238    Basic Metabolic Panel: Recent Labs  Lab 05/02/21 2046 05/03/21 0447 05/04/21 0544 05/05/21 0406 05/06/21 0601 05/07/21 0635 05/08/21 0612  NA  --    < > 137 135 137 136 137  K  --    < > 3.5 3.6 3.4* 3.5 3.7  CL  --    < > 106 104 103 103 104  CO2  --    < > GLUCOSE  --    < > 103* 110* 103* 102* 96  BUN  --    < > 5* CREATININE  --    < > 0.83 0.84 0.94 1.01 1.09  CALCIUM  --    < > 9.0 9.6 9.7 9.3 9.4  MG 1.8  --  1.8  --  1.9 2.0 1.9  PHOS  --   --   --   --  5.4* 4.6 4.5   < > = values in this interval not displayed.    GFR: Estimated Creatinine Clearance: 72.1 mL/min (by C-G formula based on SCr of 1.09 mg/dL). Liver Function Tests: Recent Labs  Lab 05/04/21 0544 05/05/21 0406 05/06/21 0601 05/07/21 0635 05/08/21 0612  AST 79* 75* 57* 50* 37  ALT 52* 59* 54* 49* 44  ALKPHOS 90 108 100 91 83  BILITOT 0.7 0.5 0.5 0.5 0.4  PROT 6.4* 7.1 7.0 7.0 6.7  ALBUMIN 3.3* 3.7 3.5 3.5 3.4*    Recent Labs  Lab 05/02/21 0728 05/06/21 0601 05/07/21 0635 05/08/21 0612  LIPASE 189* 102* 95* 82*    Recent Labs  Lab 05/03/21 1025  AMMONIA 18    Coagulation Profile: Recent Labs  Lab 05/02/21 1409   INR 0.9    Cardiac Enzymes: No results for input(s): CKTOTAL, CKMB, CKMBINDEX, TROPONINI in the last 168 hours. BNP (last 3 results) No results for input(s): PROBNP in the last 8760 hours. HbA1C: No results for input(s): HGBA1C in the last 72  hours. CBG: Recent Labs  Lab 05/03/21 0814 05/04/21 0726 05/04/21 0740 05/04/21 1140  GLUCAP 94 109* 95 107*    Lipid Profile: No results for input(s): CHOL, HDL, LDLCALC, TRIG, CHOLHDL, LDLDIRECT in the last 72 hours.  Thyroid Function Tests: No results for input(s): TSH, T4TOTAL, FREET4, T3FREE, THYROIDAB in the last 72 hours. Anemia Panel: No results for input(s): VITAMINB12, FOLATE, FERRITIN, TIBC, IRON, RETICCTPCT in the last 72 hours.  Urine analysis:    Component Value Date/Time   COLORURINE YELLOW (A) 05/02/2021 1303   APPEARANCEUR CLEAR (A) 05/02/2021 1303   LABSPEC 1.008 05/02/2021 1303   PHURINE 7.0 05/02/2021 1303   GLUCOSEU NEGATIVE 05/02/2021 1303   HGBUR NEGATIVE 05/02/2021 1303   BILIRUBINUR NEGATIVE 05/02/2021 1303   KETONESUR NEGATIVE 05/02/2021 1303   PROTEINUR NEGATIVE 05/02/2021 1303   NITRITE NEGATIVE 05/02/2021 1303   LEUKOCYTESUR NEGATIVE 05/02/2021 1303   Sepsis Labs: @LABRCNTIP (procalcitonin:4,lacticidven:4)  ) Recent Results (from the past 240 hour(s))  Resp Panel by RT-PCR (Flu A&B, Covid) Nasopharyngeal Swab     Status: None   Collection Time: 05/02/21 10:13 AM   Specimen: Nasopharyngeal Swab; Nasopharyngeal(NP) swabs in vial transport medium  Result Value Ref Range Status   SARS Coronavirus 2 by RT PCR NEGATIVE NEGATIVE Final    Comment: (NOTE) SARS-CoV-2 target nucleic acids are NOT DETECTED.  The SARS-CoV-2 RNA is generally detectable in upper respiratory specimens during the acute phase of infection. The lowest concentration of SARS-CoV-2 viral copies this assay can detect is 138 copies/mL. A negative result does not preclude SARS-Cov-2 infection and should not be used as the sole  basis for treatment or other patient management decisions. A negative result may occur with  improper specimen collection/handling, submission of specimen other than nasopharyngeal swab, presence of viral mutation(s) within the areas targeted by this assay, and inadequate number of viral copies(<138 copies/mL). A negative result must be combined with clinical observations, patient history, and epidemiological information. The expected result is Negative.  Fact Sheet for Patients:  05/04/21  Fact Sheet for Healthcare Providers:  BloggerCourse.com  This test is no t yet approved or cleared by the [employer] FDA and  has been authorized for detection and/or diagnosis of SARS-CoV-2 by FDA under an Emergency Use Authorization (EUA). This EUA will remain  in effect (meaning this test can be used) for the duration of the COVID-19 declaration under Section 564(b)(1) of the Act, 21 U.S.C.section 360bbb-3(b)(1), unless the authorization is terminated  or revoked sooner.       Influenza A by PCR NEGATIVE NEGATIVE Final   Influenza B by PCR NEGATIVE NEGATIVE Final    Comment: (NOTE) The Xpert Xpress SARS-CoV-2/FLU/RSV plus assay is intended as an aid in the diagnosis of influenza from Nasopharyngeal swab specimens and should not be used as a sole basis for treatment. Nasal washings and aspirates are unacceptable for Xpert Xpress SARS-CoV-2/FLU/RSV testing.  Fact Sheet for Patients: [place]  Fact Sheet for Healthcare Providers: BloggerCourse.com  This test is not yet approved or cleared by the [employer] FDA and has been authorized for detection and/or diagnosis of SARS-CoV-2 by FDA under an Emergency Use Authorization (EUA). This EUA will remain in effect (meaning this test can be used) for the duration of the COVID-19 declaration under Section 564(b)(1) of the Act,  21 U.S.C. section 360bbb-3(b)(1), unless the authorization is terminated or revoked.  Performed at [facility], [address], [place], [place]          Radiology  Studies: MR Lumbar Spine W Wo Contrast  Result Date: 05/06/2021 CLINICAL DATA:  Low back pain, > 6 wks, r/o nerve root compression EXAM: MRI LUMBAR SPINE WITHOUT AND WITH CONTRAST TECHNIQUE: Multiplanar and multiecho pulse sequences of the lumbar spine were obtained without and with intravenous contrast. CONTRAST:  8mL GADAVIST GADOBUTROL 1 MMOL/ML IV SOLN COMPARISON:  None. FINDINGS: Segmentation:  Standard. Alignment:  Multilevel trace retrolisthesis.  Mild dextrocurvature. Vertebrae: Vertebral body heights are maintained apart from degenerative endplate irregularity at L5-S1. Degenerative endplate marrow changes are present at this level including mild edema. Conus medullaris and cauda equina: Conus extends to the T12-L1 level. Conus and cauda equina appear normal. No abnormal intrathecal enhancement. Paraspinal and other soft tissues: Unremarkable. Disc levels: L1-L2: Minimal disc bulge with trace left central extrusion extending slightly above disc level. No canal or foraminal stenosis. L2-L3: Disc bulge slightly eccentric to the right. No canal or foraminal stenosis. L3-L4: Disc bulge with prominent foraminal components bilaterally. No canal stenosis. Slight effacement of the left subarticular recess. Mild right and mild to moderate left foraminal stenosis. Disc abuts and may compress the exiting left L3 nerve root. L4-L5: Disc bulge with prominent foraminal components bilaterally. Facet arthropathy. No canal stenosis. Slight effacement of subarticular recesses. Moderate foraminal stenosis. L5-S1: Disc bulge with endplate osteophytic ridging. Facet arthropathy. No canal stenosis. Moderate to marked foraminal stenosis. Disc/osteophyte abuts and may compress the exiting right greater than left L5 nerve roots.  IMPRESSION: Multilevel degenerative changes as detailed above. No high-grade canal stenosis. Foraminal narrowing is greatest at L5-S1. Potential exiting nerve root compression on the left at L3-L4 and bilaterally at L5-S1. Electronically Signed   By: Guadlupe Spanish M.D.   On: 05/06/2021 12:42        Scheduled Meds:  amoxicillin  875 mg Oral BID   chlordiazePOXIDE  60 mg Oral TID   ferrous sulfate  325 mg Oral QODAY   gabapentin  100 mg Oral TID   gabapentin  600 mg Oral TID   hydrochlorothiazide  12.5 mg Oral Daily   hydrocortisone  25 mg Rectal BID   lisinopril  40 mg Oral Daily   magnesium oxide  400 mg Oral BID   nicotine  21 mg Transdermal Daily   pantoprazole  40 mg Oral BID   thiamine  100 mg Oral Daily   Or   thiamine  100 mg Intravenous Daily   Continuous Infusions:   LOS: 5 days   The patient is critically ill with multiple organ systems failure and requires high complexity decision making for assessment and support, frequent evaluation and titration of therapies, application of advanced monitoring technologies and extensive interpretation of multiple databases. Critical Care Time devoted to patient care services described in this note  Time spent: 40 minutes     Rane Dumm, Roselind Messier, MD [facility]   If 7PM-7AM, please contact night-coverage 05/08/2021, 7:29 AM

## 2021-05-08 NOTE — TOC Progression Note (Signed)
Transition of Care [facility]) - Progression Note    Patient Details  Name: Ronald Watts MRN: [id number] Date of Birth: Jul 01, 1966  [facility] CM/SW Contact  Bing Quarry, RN Phone Number: 05/08/2021, 10:26 AM  Clinical Narrative:  10/29: Spoke with patient regarding discharge planning and is not sure he wants/needs [facility] PT. Wishes to discuss with spouse who will be on the unit after lunch. Has a RW and shower chair at home. Wife transports to appointments. Will call back after lunch. Gabriel Cirri RN CM      Expected Discharge Plan: Home/Self Care Barriers to Discharge: Continued Medical Work up  Expected Discharge Plan and Services Expected Discharge Plan: Home/Self Care   Discharge Planning Services: CM Consult   Living arrangements for the past 2 months: Single Family Home                 DME Arranged: N/A DME Agency: NA       HH Arranged: NA HH Agency: NA         Social Determinants of Health (SDOH) Interventions    Readmission Risk Interventions No flowsheet data found.

## 2021-05-08 NOTE — TOC Progression Note (Signed)
Transition of Care [facility]) - Progression Note    Patient Details  Name: Ronald Watts MRN: [id number] Date of Birth: September 17, 1965  Transition of Care [facility]) CM/SW Contact  Bing Quarry, RN Phone Number: 05/08/2021, 2:17 PM  Clinical Narrative:  Called back to speak with spouse Ronald Watts at [phone]. She wished to decide on [facility] services tomorrow after speaking with son where patient will discharge to. Confirmed RW and shower chair DME at home. Will contact again at noon tomorrow. Gabriel Cirri RN CM      Expected Discharge Plan: Home/Self Care Barriers to Discharge: Continued Medical Work up  Expected Discharge Plan and Services Expected Discharge Plan: Home/Self Care   Discharge Planning Services: CM Consult   Living arrangements for the past 2 months: Single Family Home                 DME Arranged: N/A DME Agency: NA       HH Arranged: NA HH Agency: NA         Social Determinants of Health (SDOH) Interventions    Readmission Risk Interventions No flowsheet data found.

## 2021-05-08 NOTE — Evaluation (Signed)
Occupational Therapy Evaluation Patient Details Name: Ronald Watts MRN: [id number] DOB: 05/02/1966 Today's Date: 05/08/2021   History of Present Illness 55 y.o. 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   Clinical Impression   Ronald Watts was seen for OT evaluation this date. Prior to hospital admission, pt was generally independent with ADL management. Pt lives with his spouse, but voices plan to potentially move to his Son's after DC. Pt requires assistance with IADL management including driving, home management, etc as he is legally blind. Currently pt demonstrates impairments as described below (See OT problem list) which functionally limit his ability to perform ADL/self-care tasks. Pt currently requires CGA for functional mobility and SUPERVISION for LB ADL management.  Pt would benefit from skilled OT services to address noted impairments and functional limitations (see below for any additional details) in order to maximize safety and independence while minimizing falls risk and caregiver burden. Upon hospital discharge, recommend HHOT to maximize pt safety and return to functional independence during meaningful occupations of daily life.       Recommendations for follow up therapy are one component of a multi-disciplinary discharge planning process, led by the attending physician.  Recommendations may be updated based on patient status, additional functional criteria and insurance authorization.   Follow Up Recommendations  Home health OT    Assistance Recommended at Discharge PRN  Functional Status Assessment  Patient has had a recent decline in their functional status and demonstrates the ability to make significant improvements in function in a reasonable and  predictable amount of time.  Equipment Recommendations  None recommended by OT    Recommendations for Other Services       Precautions / Restrictions Precautions Precautions: Fall Restrictions Weight Bearing Restrictions: No      Mobility Bed Mobility Overal bed mobility: Modified Independent             General bed mobility comments: Able to come to sitting at EOB with HOB elevated.    Transfers Overall transfer level: Needs assistance Equipment used: Rolling walker (2 wheels) Transfers: Sit to/from [employer] Sit to Stand: Min guard Stand pivot transfers: Min guard         General transfer comment: CGA to steady and for safety.      Balance Overall balance assessment: Needs assistance Sitting-balance support: Feet supported;No upper extremity supported Sitting balance-Leahy Scale: Good Sitting balance - Comments: Steady static/dynamic sitting EOB. No LOB appreciated with weight shift during functional activity.   Standing balance support: Reliant on assistive device for balance;During functional activity Standing balance-Leahy Scale: Fair                             ADL either performed or assessed with clinical judgement   ADL Overall ADL's : Needs assistance/impaired                                     Functional mobility during ADLs: Min guard;Rolling walker (2 wheels);Cueing for safety General ADL Comments: Pt able to don LB clothing while seated EOB with supervision for safety. Close supervision for functional mobility using RW. Endorses hx of falling including one (unwhitnessed) fall while acutely hospitalized.  Vision Baseline Vision/History: 2 Legally blind Patient Visual Report: No change from baseline       Perception     Praxis      Pertinent Vitals/Pain Pain Assessment: 0-10 Pain Score: 9  Pain Location: LBP Pain Descriptors / Indicators: Guarding;Shooting;Constant;Discomfort Pain  Intervention(s): Limited activity within patient's tolerance;Monitored during session;Repositioned;Patient requesting pain meds-RN notified     Hand Dominance Right   Extremity/Trunk Assessment Upper Extremity Assessment Upper Extremity Assessment: Generalized weakness   Lower Extremity Assessment Lower Extremity Assessment: Generalized weakness;Defer to PT evaluation   Cervical / Trunk Assessment Cervical / Trunk Assessment: Kyphotic   Communication Communication Communication: No difficulties   Cognition Arousal/Alertness: Awake/alert Behavior During Therapy: WFL for tasks assessed/performed Overall Cognitive Status: Within Functional Limits for tasks assessed                                 General Comments: A&Ox4     General Comments       Exercises Other Exercises Other Exercises: Pt/spouse educated on role of OT in acute setting, safe use of AE/DME for ADL management, considerations for follow-up regarding vision impairments, and routines modifications to support safety and functional independence upon hospital DC.   Shoulder Instructions      Home Living Family/patient expects to be discharged to:: Private residence Living Arrangements: Spouse/significant other Available Help at Discharge: Family;Available PRN/intermittently;Available 24 hours/day Type of Home: House Home Access: Stairs to enter [employer] of Steps: 1 Entrance Stairs-Rails: None Home Layout: One level     Bathroom Shower/Tub: Chief Strategy Officer: Handicapped height     Home Equipment: Rollator (4 wheels);Cane - single point;Shower seat;Grab bars - tub/shower   Additional Comments: Pt owns AE as listed above but does not generally use it. He has grab bars but they need to be installed.      Prior Functioning/Environment Prior Level of Function : Independent/Modified Independent;History of Falls (last six months)       Physical Assist : ADLs  (physical)   ADLs (physical): IADLs   ADLs Comments: States he is typically independent however has required increased assist over the past month.        OT Problem List: Decreased strength;Decreased coordination;Decreased activity tolerance;Decreased safety awareness;Impaired balance (sitting and/or standing);Decreased knowledge of use of DME or AE      OT Treatment/Interventions: Self-care/ADL training;Therapeutic exercise;Therapeutic activities;Energy conservation;DME and/or AE instruction;Patient/family education;Balance training    OT Goals(Current goals can be found in the care plan section) Acute Rehab OT Goals Patient Stated Goal: To get stronger. (Pt and spouse both eager to talk to MD, MD notified via secure chat.) OT Goal Formulation: With patient Time For Goal Achievement: 05/22/21 Potential to Achieve Goals: Good ADL Goals Pt Will Perform Grooming: sitting;with modified independence Pt Will Perform Lower Body Dressing: sit to/from stand;with modified independence;with adaptive equipment Pt Will Transfer to Toilet: ambulating;bedside commode;with modified independence Pt Will Perform Toileting - Clothing Manipulation and hygiene: with modified independence;sit to/from stand;with adaptive equipment  OT Frequency: Min 1X/week   Barriers to D/C:            Co-evaluation              AM-PAC OT "6 Clicks" Daily Activity     Outcome Measure Help from another person eating meals?: None Help from another person taking care of personal grooming?: None Help from another person toileting, which includes using toliet, bedpan, or  urinal?: A Little Help from another person bathing (including washing, rinsing, drying)?: A Little Help from another person to put on and taking off regular upper body clothing?: None Help from another person to put on and taking off regular lower body clothing?: A Little 6 Click Score: 21   End of Session Equipment Utilized During Treatment:  Gait belt;Rolling walker (2 wheels) Nurse Communication: Patient requests pain meds  Activity Tolerance: Patient tolerated treatment well Patient left: in bed;with bed alarm set;with family/visitor present;with call bell/phone within reach (Seated EOB with spouse present.)  OT Visit Diagnosis: Other abnormalities of gait and mobility (R26.89);History of falling (Z91.81);Pain Pain - Right/Left: Right Pain - part of body: Knee;Leg;Ankle and joints of foot;Hip                Time: 8250-5397 OT Time Calculation (min): 39 min Charges:  OT General Charges $OT Visit: 1 Visit OT Evaluation $OT Eval Moderate Complexity: 1 Mod OT Treatments $Self Care/Home Management : 23-37 mins  Rockney Ghee, M.S., OTR/L Ascom: [phone] 05/08/21, 11:27 AM

## 2021-05-09 DIAGNOSIS — S42001D Fracture of unspecified part of right clavicle, subsequent encounter for fracture with routine healing: Secondary | ICD-10-CM

## 2021-05-09 DIAGNOSIS — G8929 Other chronic pain: Secondary | ICD-10-CM

## 2021-05-09 DIAGNOSIS — M549 Dorsalgia, unspecified: Secondary | ICD-10-CM

## 2021-05-09 LAB — LIPASE, BLOOD: Lipase: 65 U/L — ABNORMAL HIGH (ref 11–51)

## 2021-05-09 LAB — CBC WITH DIFFERENTIAL/PLATELET
Abs Immature Granulocytes: 0.04 10*3/uL (ref 0.00–0.07)
Basophils Absolute: 0 10*3/uL (ref 0.0–0.1)
Basophils Relative: 1 %
Eosinophils Absolute: 0.2 10*3/uL (ref 0.0–0.5)
Eosinophils Relative: 3 %
HCT: 30.2 % — ABNORMAL LOW (ref 39.0–52.0)
Hemoglobin: 9.9 g/dL — ABNORMAL LOW (ref 13.0–17.0)
Immature Granulocytes: 1 %
Lymphocytes Relative: 23 %
Lymphs Abs: 1.2 10*3/uL (ref 0.7–4.0)
MCH: 29.6 pg (ref 26.0–34.0)
MCHC: 32.8 g/dL (ref 30.0–36.0)
MCV: 90.1 fL (ref 80.0–100.0)
Monocytes Absolute: 1.1 10*3/uL — ABNORMAL HIGH (ref 0.1–1.0)
Monocytes Relative: 21 %
Neutro Abs: 2.8 10*3/uL (ref 1.7–7.7)
Neutrophils Relative %: 51 %
Platelets: 253 10*3/uL (ref 150–400)
RBC: 3.35 MIL/uL — ABNORMAL LOW (ref 4.22–5.81)
RDW: 16.5 % — ABNORMAL HIGH (ref 11.5–15.5)
WBC: 5.4 10*3/uL (ref 4.0–10.5)
nRBC: 0 % (ref 0.0–0.2)

## 2021-05-09 LAB — COMPREHENSIVE METABOLIC PANEL
ALT: 38 U/L (ref 0–44)
AST: 33 U/L (ref 15–41)
Albumin: 3.3 g/dL — ABNORMAL LOW (ref 3.5–5.0)
Alkaline Phosphatase: 83 U/L (ref 38–126)
Anion gap: 7 (ref 5–15)
BUN: 14 mg/dL (ref 6–20)
CO2: 27 mmol/L (ref 22–32)
Calcium: 9.4 mg/dL (ref 8.9–10.3)
Chloride: 104 mmol/L (ref 98–111)
Creatinine, Ser: 1.12 mg/dL (ref 0.61–1.24)
GFR, Estimated: >60 mL/min (ref >60)
Glucose, Bld: 102 mg/dL — ABNORMAL HIGH (ref 70–99)
Potassium: 4.2 mmol/L (ref 3.5–5.1)
Sodium: 138 mmol/L (ref 135–145)
Total Bilirubin: 0.5 mg/dL (ref 0.3–1.2)
Total Protein: 6.5 g/dL (ref 6.5–8.1)

## 2021-05-09 LAB — PHOSPHORUS: Phosphorus: 4.3 mg/dL (ref 2.5–4.6)

## 2021-05-09 LAB — MAGNESIUM: Magnesium: 1.9 mg/dL (ref 1.7–2.4)

## 2021-05-09 MED ORDER — ASCORBIC ACID 250 MG PO TABS: 250.0000 mg | ORAL_TABLET | Freq: Every day | ORAL | 0 refills | Status: DC

## 2021-05-09 MED ORDER — HYDROCORTISONE ACETATE 25 MG RE SUPP: 25.0000 mg | Freq: Two times a day (BID) | RECTAL | 0 refills | Status: DC

## 2021-05-09 MED ORDER — HYDROCHLOROTHIAZIDE 12.5 MG PO TABS: 12.5000 mg | ORAL_TABLET | Freq: Every day | ORAL | 0 refills | Status: DC

## 2021-05-09 MED ORDER — CHLORDIAZEPOXIDE HCL 10 MG PO CAPS: 60.0000 mg | ORAL_CAPSULE | Freq: Three times a day (TID) | ORAL | 0 refills | Status: DC

## 2021-05-09 MED ORDER — THIAMINE HCL 100 MG PO TABS: 100.0000 mg | ORAL_TABLET | Freq: Every day | ORAL | 0 refills | Status: DC

## 2021-05-09 MED ORDER — NICOTINE 21 MG/24HR TD PT24: 21.0000 mg | MEDICATED_PATCH | Freq: Every day | TRANSDERMAL | 0 refills | Status: DC

## 2021-05-09 MED ORDER — OXYCODONE HCL 5 MG PO TABS: 5.0000 mg | ORAL_TABLET | Freq: Four times a day (QID) | ORAL | 0 refills | Status: AC | PRN

## 2021-05-09 MED ORDER — FERROUS SULFATE 325 (65 FE) MG PO TABS: 325.0000 mg | ORAL_TABLET | ORAL | 0 refills | Status: AC

## 2021-05-09 MED ORDER — LISINOPRIL 40 MG PO TABS: 40.0000 mg | ORAL_TABLET | Freq: Every day | ORAL | 0 refills | Status: DC

## 2021-05-09 NOTE — Progress Notes (Signed)
Patient has been discharged home.  Discharge papers given and explained to patient and daughter, Nyxon Strupp. They verbalized understanding.  Meds and f/u appointments reviewed.  Rx given.

## 2021-05-09 NOTE — Discharge Summary (Signed)
Physician Discharge Summary  Ronald Watts NWG:[id number] DOB: [birth year] DOA: 05/02/2021  PCP: Leanna Sato, MD  Admit date: 05/02/2021 Discharge date: 05/09/2021  Time spent: 35 minutes  Recommendations for Outpatient Follow-up:   Rectal bleeding with mild blood loss anemia (admission hemoglobin 11.3) -likely due to hemorrhoids.   -Anemia panel unremarkable, only mildly reduced sat ratio. -Showed normal iron, ferritin, TIBC, vitamin B12 and folate. -11/28 resolved --GI consulted, signed off --Outpatient EGD and colonoscopy -- Transfuse for hemoglobin <7  --Avoid NSAIDs and anticoagulants -- Resume oral PPI daily --Anusol suppositories twice daily -- Oral iron supplement every other day per GI -Vitamin C 250 mg daily Lab Results  Component Value Date   HGB 9.9 (L) 05/09/2021   HGB 10.0 (L) 05/08/2021   HGB 10.1 (L) 05/07/2021   HGB 10.7 (L) 05/06/2021   HGB 11.0 (L) 05/05/2021  -Follow-up with Dr. Elfredia Nevins, GI by phone on 10/31.  Ask if with stable hemoglobin he would still like to proceed with EGD and colonoscopy as an outpatient   acute pancreatitis  -likely due to alcohol abuse. -Abdominal pain, nausea vomiting have improved. -- Diet advanced and appears tolerating -- Stop IV fluids --Antiemetics and pain control as needed -10/26 resolved Lab Results  Component Value Date   LIPASE 65 (H) 05/09/2021   LIPASE 82 (H) 05/08/2021   LIPASE 95 (H) 05/07/2021   LIPASE 102 (H) 05/06/2021   LIPASE 189 (H) 05/02/2021  -Patient must ABSOLUTELY abstain from alcohol use.  Lipase still slightly above normal but trending down.  Patient asymptomatic     ETOH abuse with withdrawal syndrome, uncomplicated  - patient expresses desire to detox from alcohol.   He has mild tremors, reports intermittent mild hallucinations. --CIWA protocol with as needed Ativan --Recommend maintenance medication in the outpatient setting -specifically naltrexone or acamprosate.  Would not  dispense medication at discharge.  Would allow EtOH program to dispense medication in order to monitor for any side effects -- 10/26 TOC consulted to provide local resources for assistance   Transaminitis  -most likely due to alcohol abuse.  LFTs improving. -Acute hepatitis panel and HIV screen negative. -LFTs normalized     Dental infection  -no fever or leukocytosis.  No signs of systemic infection or sepsis. --Continue amoxicillin prescribed as outpatient   HTN -continue home medication   GERD  -10/27 PO home meds   COPD/Tobacco abuse  -stable without signs of acute exacerbation. --Bronchodilators -Patient and wife counseled extensively on need to discontinue smoking. -Nicotine patch   Hx stroke -not currently on home medications.   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   -No acute issues or neurologic symptoms. 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  Consistent with patient's pain and sciatica.   -10/27 MRI L-spine shows possible nerve root compression see results below.  Will consult neurosurgeon; amenable to surgical correction?   -10/28 discussed case with Neurosurgeon Dr. Marikay Alar and he concurs that there is no surgical intervention needed at this point.  Recommends PT and referral to pain management center. -10/29 placed HH, PT, orders, secondary to patient speaking with NCM and expressing that would not be able to afford ANY CO-pay for an outpatient PT regimen. -On 10/31 contact [place] [facility] to establish care.  You are being referred by Dr. Joseph Art for evaluation ESI of L-spine, PT of L-spine, TENS unit placement of L-spine.  RIGHT clavicle fracture POA - Old right  clavicle fracture see x-ray results below   Bilateral lower extremity PVD - 9/23 ABI performed by Dr. Dorothyann Peng at [facility]; Normal ABIs no evidence of significant peripheral disease -NEGATIVE PVD in  bilateral lower extremity       Discharge Diagnoses:  Principal Problem:   Rectal bleeding Active Problems:   Hypertension   HLD (hyperlipidemia)   GERD (gastroesophageal reflux disease)   COPD (chronic obstructive pulmonary disease) (HCC)   Anemia due to blood loss   Stroke [facility])   Alcohol abuse   Tobacco abuse   Alcoholic pancreatitis   Abnormal LFTs   Dental infection   Acute pancreatitis   Lumbar nerve root impingement   Right clavicle fracture   Discharge Condition: Stable  Diet recommendation: Heart healthy  Filed Weights   05/02/21 0726  Weight: 65.8 kg    History of present illness:  55 y.o. WM PMHx ETOH Abuse, tobacco abuse, HTN, HLD,COPD, tobacco abuse, Stroke resulting in BLINDNESS,, GERD, Anemia, Sciatica, Hx GI bleeding,    Presents with rectal bleeding.  He also reported nausea, vomiting and abdominal pain.     In the ED, labs showed Hbg 11.3, elevated lipase 189, and elevated LFT's.  Hemodynamically stable.  Admitted to [facility] service with GI consulted for further evaluation and management of GI bleeding and acute pancreatitis.  Hospital Course:  -See above  Procedures: Abdominal ultrasound showed:  1. Relative hypoechogenicity of the pancreatic parenchyma suggests pancreatic edema which would be consistent with the clinical history of pancreatitis. No peripancreatic fluid or pseudocyst identified. 2. Hepatic steatosis   Liver Doppler ultrasound showed: 1. Widely patent portal veins with normal hepatopetal directional flow. 2. Normally patent hepatic veins. 3. Echogenic and coarsened hepatic parenchyma suggests underlying steatosis 10/26 DG L spine:-Multilevel degenerative change with moderate disc space narrowing at L4-L5  -Advanced degenerative changes at L5-S1. Facet degenerative changes of the lower lumbar spine.  -Mild degenerative changes elsewhere in the spine.  -Possible punctate left kidney stones. 10/26 DG RIGHT shoulder:No acute  osseous abnormality.  Old right clavicle fracture 10/27 MRI L-spine W/W. Wo contrast:No high-grade canal stenosis. Foraminal narrowing is greatest at L5-S1. Potential exiting nerve root compression on the left at L3-L4 and bilaterally at L5-S1.    Consultations: GI DO Nanetta Batty Neurosurgeon Dr. Marikay Alar  Cultures  10/24 acute hepatitis panel negative 10/24 HIV screen negative  Antibiotics Anti-infectives (From admission, onward)    Start     Ordered Stop   05/02/21 2000  amoxicillin (AMOXIL) 250 MG/5ML suspension 875 mg        05/02/21 1439 05/08/21 2128         Discharge Exam: Vitals:   05/08/21 1646 05/08/21 2046 05/09/21 0612 05/09/21 0827  BP: 114/89 107/80 111/88 (!) 119/96  Pulse: 94 81 91 (!) 101  Resp: Temp: 98.2 F (36.8 C) 97.7 F (36.5 C) 98 F (36.7 C) 98.2 F (36.8 C)  TempSrc:      SpO2: 100% 98% 98% 99%  Weight:      Height:        General: A/O x4 No acute respiratory distress, cachectic Eyes: negative scleral hemorrhage, negative anisocoria, negative icterus ENT: Negative Runny nose, negative gingival bleeding, Neck:  Negative scars, masses, torticollis, lymphadenopathy, JVD Lungs: Clear to auscultation bilaterally without wheezes or crackles Cardiovascular: Regular rate and rhythm without murmur gallop or rub normal S1 and S2  Discharge Instructions   Allergies as of 05/09/2021   No Known Allergies  Medication List     STOP taking these medications    amoxicillin 875 MG tablet Commonly known as: AMOXIL   lisinopril-hydrochlorothiazide 20-12.5 MG tablet Commonly known as: ZESTORETIC   potassium chloride 10 MEQ CR capsule Commonly known as: MICRO-K       TAKE these medications    ascorbic acid 250 MG tablet Commonly known as: VITAMIN C Take 1 tablet (250 mg total) by mouth daily. Start taking on: May 10, 2021   atorvastatin 40 MG tablet Commonly known as: LIPITOR Take 40 mg by mouth daily.    chlordiazePOXIDE 10 MG capsule Commonly known as: LIBRIUM Take 6 capsules (60 mg total) by mouth 3 (three) times daily.   cyclobenzaprine 10 MG tablet Commonly known as: FLEXERIL Take 1 tablet by mouth at bedtime.   ferrous sulfate 325 (65 FE) MG tablet Take 1 tablet (325 mg total) by mouth every other day. Start taking on: May 10, 2021   gabapentin 100 MG capsule Commonly known as: NEURONTIN Take 1 capsule by mouth 3 (three) times daily. What changed: Another medication with the same name was removed. Continue taking this medication, and follow the directions you see here.   hydrochlorothiazide 12.5 MG tablet Commonly known as: HYDRODIURIL Take 1 tablet (12.5 mg total) by mouth daily. Start taking on: May 10, 2021   hydrocortisone 25 MG suppository Commonly known as: ANUSOL-HC Place 1 suppository (25 mg total) rectally 2 (two) times daily.   lisinopril 40 MG tablet Commonly known as: ZESTRIL Take 1 tablet (40 mg total) by mouth daily. Start taking on: May 10, 2021   magnesium oxide 400 MG tablet Commonly known as: MAG-OX Take 1 tablet by mouth 2 (two) times daily.   nicotine 21 mg/24hr patch Commonly known as: NICODERM CQ - dosed in mg/24 hours Place 1 patch (21 mg total) onto the skin daily. Start taking on: May 10, 2021   omeprazole 20 MG capsule Commonly known as: PRILOSEC Take 1 capsule by mouth daily.   ondansetron 4 MG disintegrating tablet Commonly known as: Zofran ODT Take 1 tablet (4 mg total) by mouth every 8 (eight) hours as needed for nausea or vomiting.   oxyCODONE 5 MG immediate release tablet Commonly known as: Oxy IR/ROXICODONE Take 1 tablet (5 mg total) by mouth every 6 (six) hours as needed for up to 5 days for severe pain or breakthrough pain (if tylenol ineffective).   sildenafil 100 MG tablet Commonly known as: VIAGRA Take 100 mg by mouth daily as needed for erectile dysfunction.   thiamine 100 MG tablet Take 1 tablet  (100 mg total) by mouth daily. Start taking on: May 10, 2021   traMADol 50 MG tablet Commonly known as: Ultram Take 1 tablet (50 mg total) by mouth every 6 (six) hours as needed.       No Known Allergies  Follow-up Information     [facility]. Schedule an appointment as soon as possible for a visit in 1 day(s).   Specialty: Pain Medicine Why: On 10/31 contact [place] [facility] to establish care.  You are being referred by Dr. Joseph Art for evaluation ESI of L-spine, PT of L-spine, TENS unit placement of L-spine.  Patient to make own follow up appt Office closed at this time Contact information: [address] [id number] [place] [facility] [address] [phone]        [name], DO. Call in 1 day(s).   Why: Follow-up with Dr. Jeannett Senior  Timothy Lasso, GI by phone on 10/31.  Ask if with stable hemoglobin he would still like to proceed with EGD and colonoscopy as an outpatient  Patient to make own follow up appt Office closed at this time Contact information: [address] [facility] [place] [address] [phone]                  The results of significant diagnostics from this hospitalization (including imaging, microbiology, ancillary and laboratory) are listed below for reference.    Significant Diagnostic Studies: DG Lumbar Spine 2-3 Views  Result Date: 05/05/2021 CLINICAL DATA:  Low back pain EXAM: LUMBAR SPINE - 2-3 VIEW COMPARISON:  12/19/2018 report FINDINGS: Mild dextrocurvature. Five non rib-bearing lumbar type vertebra. Lumbar sagittal alignment within normal limits. Vertebral body heights are maintained. Multilevel degenerative change with moderate disc space narrowing at L4-L5 and advanced degenerative changes at L5-S1. Facet degenerative changes of the lower lumbar spine. Mild degenerative changes elsewhere in the spine. Possible punctate left kidney stones.  IMPRESSION: Multilevel degenerative changes most advanced at L5-S1. Electronically Signed   By: Jasmine Pang M.D.   On: 05/05/2021 20:38   DG Shoulder Right  Result Date: 05/05/2021 CLINICAL DATA:  Right shoulder pain EXAM: RIGHT SHOULDER - 2+ VIEW COMPARISON:  None. FINDINGS: Old fracture deformity of the right clavicle. No acute displaced fracture or malalignment. The right lung apex is clear IMPRESSION: No acute osseous abnormality.  Old right clavicle fracture Electronically Signed   By: Jasmine Pang M.D.   On: 05/05/2021 20:39   MR Lumbar Spine W Wo Contrast  Result Date: 05/06/2021 CLINICAL DATA:  Low back pain, > 6 wks, r/o nerve root compression EXAM: MRI LUMBAR SPINE WITHOUT AND WITH CONTRAST TECHNIQUE: Multiplanar and multiecho pulse sequences of the lumbar spine were obtained without and with intravenous contrast. CONTRAST:  23mL GADAVIST GADOBUTROL 1 MMOL/ML IV SOLN COMPARISON:  None. FINDINGS: Segmentation:  Standard. Alignment:  Multilevel trace retrolisthesis.  Mild dextrocurvature. Vertebrae: Vertebral body heights are maintained apart from degenerative endplate irregularity at L5-S1. Degenerative endplate marrow changes are present at this level including mild edema. Conus medullaris and cauda equina: Conus extends to the T12-L1 level. Conus and cauda equina appear normal. No abnormal intrathecal enhancement. Paraspinal and other soft tissues: Unremarkable. Disc levels: L1-L2: Minimal disc bulge with trace left central extrusion extending slightly above disc level. No canal or foraminal stenosis. L2-L3: Disc bulge slightly eccentric to the right. No canal or foraminal stenosis. L3-L4: Disc bulge with prominent foraminal components bilaterally. No canal stenosis. Slight effacement of the left subarticular recess. Mild right and mild to moderate left foraminal stenosis. Disc abuts and may compress the exiting left L3 nerve root. L4-L5: Disc bulge with prominent foraminal components  bilaterally. Facet arthropathy. No canal stenosis. Slight effacement of subarticular recesses. Moderate foraminal stenosis. L5-S1: Disc bulge with endplate osteophytic ridging. Facet arthropathy. No canal stenosis. Moderate to marked foraminal stenosis. Disc/osteophyte abuts and may compress the exiting right greater than left L5 nerve roots. IMPRESSION: Multilevel degenerative changes as detailed above. No high-grade canal stenosis. Foraminal narrowing is greatest at L5-S1. Potential exiting nerve root compression on the left at L3-L4 and bilaterally at L5-S1. Electronically Signed   By: Guadlupe Spanish M.D.   On: 05/06/2021 12:42   US Abdomen Complete  Result Date: 05/03/2021 CLINICAL DATA:  Alcohol abuse, acute alcoholic pancreatitis with elevated LFTs. EXAM: ABDOMEN ULTRASOUND COMPLETE COMPARISON:  None. FINDINGS: Gallbladder: No gallstones or wall thickening visualized. No sonographic Murphy sign noted by  sonographer. Common bile duct: Diameter: Normal at 4 mm Liver: Increased parenchymal echogenicity with slight coarsening of the echotexture. The adjacent right renal parenchyma appears hypoechoic in comparison. Portal vein is patent on color Doppler imaging with normal direction of blood flow towards the liver. IVC: No abnormality visualized. Pancreas: Pancreatic parenchyma is relatively hypoechoic suggesting edema. Spleen: Size and appearance within normal limits. Right Kidney: Length: 10.7 cm. Echogenicity within normal limits. No mass or hydronephrosis visualized. Left Kidney: Length: 12.3 cm. Echogenicity within normal limits. No mass or hydronephrosis visualized. Abdominal aorta: No aneurysm visualized. Other findings: None. IMPRESSION: 1. Relative hypoechogenicity of the pancreatic parenchyma suggests pancreatic edema which would be consistent with the clinical history of pancreatitis. No peripancreatic fluid or pseudocyst identified. 2. Hepatic steatosis. Electronically Signed   By: Malachy Moan M.D.   On: 05/03/2021 10:07   US LIVER DOPPLER  Result Date: 05/03/2021 CLINICAL DATA:  55 year old male with elevated LFTs EXAM: DUPLEX ULTRASOUND OF LIVER TECHNIQUE: Color and duplex Doppler ultrasound was performed to evaluate the hepatic in-flow and out-flow vessels. COMPARISON:  Concurrently obtained but separately dictated abdominal ultrasound FINDINGS: Liver: Slightly increased parenchymal echogenicity with coarsening of the echotexture. The adjacent renal parenchyma appears hypoechoic in comparison. No discrete lesion. Normal hepatic contour without nodularity. No focal lesion, mass or intrahepatic biliary ductal dilatation. Main Portal Vein size: 1.3 cm Portal Vein Velocities Main Prox:  22 cm/sec Main Mid: 23 cm/sec Main Dist:  28 cm/sec Right: 15 cm/sec Left: 22 cm/sec Hepatic Vein Velocities Right:  20 cm/sec Middle:  23 cm/sec Left:  79 cm/sec IVC: Present and patent with normal respiratory phasicity. Hepatic Artery Velocity:  93 cm/sec Splenic Vein Velocity:  35 cm/sec Spleen: 4.3 cm x 8.9 cm x 9.5 cm with a total volume of 188 cm^3 (411 cm^3 is upper limit normal) Portal Vein Occlusion/Thrombus: No Splenic Vein Occlusion/Thrombus: No Ascites: None Varices: None IMPRESSION: 1. Widely patent portal veins with normal hepatopetal directional flow. 2. Normally patent hepatic veins. 3. Echogenic and coarsened hepatic parenchyma suggests underlying steatosis. Electronically Signed   By: Malachy Moan M.D.   On: 05/03/2021 10:05    Microbiology: Recent Results (from the past 240 hour(s))  Resp Panel by RT-PCR (Flu A&B, Covid) Nasopharyngeal Swab     Status: None   Collection Time: 05/02/21 10:13 AM   Specimen: Nasopharyngeal Swab; Nasopharyngeal(NP) swabs in vial transport medium  Result Value Ref Range Status   SARS Coronavirus 2 by RT PCR NEGATIVE NEGATIVE Final    Comment: (NOTE) SARS-CoV-2 target nucleic acids are NOT DETECTED.  The SARS-CoV-2 RNA is generally detectable in  upper respiratory specimens during the acute phase of infection. The lowest concentration of SARS-CoV-2 viral copies this assay can detect is 138 copies/mL. A negative result does not preclude SARS-Cov-2 infection and should not be used as the sole basis for treatment or other patient management decisions. A negative result may occur with  improper specimen collection/handling, submission of specimen other than nasopharyngeal swab, presence of viral mutation(s) within the areas targeted by this assay, and inadequate number of viral copies(<138 copies/mL). A negative result must be combined with clinical observations, patient history, and epidemiological information. The expected result is Negative.  Fact Sheet for Patients:  BloggerCourse.com  Fact Sheet for Healthcare Providers:  SeriousBroker.it  This test is no t yet approved or cleared by the [place] FDA and  has been authorized for detection and/or diagnosis of SARS-CoV-2 by FDA under an Emergency Use Authorization (EUA). This EUA  will remain  in effect (meaning this test can be used) for the duration of the COVID-19 declaration under Section 564(b)(1) of the Act, 21 U.S.C.section 360bbb-3(b)(1), unless the authorization is terminated  or revoked sooner.       Influenza A by PCR NEGATIVE NEGATIVE Final   Influenza B by PCR NEGATIVE NEGATIVE Final    Comment: (NOTE) The Xpert Xpress SARS-CoV-2/FLU/RSV plus assay is intended as an aid in the diagnosis of influenza from Nasopharyngeal swab specimens and should not be used as a sole basis for treatment. Nasal washings and aspirates are unacceptable for Xpert Xpress SARS-CoV-2/FLU/RSV testing.  Fact Sheet for Patients: BloggerCourse.com  Fact Sheet for Healthcare Providers: SeriousBroker.it  This test is not yet approved or cleared by the [place] FDA and has been  authorized for detection and/or diagnosis of SARS-CoV-2 by FDA under an Emergency Use Authorization (EUA). This EUA will remain in effect (meaning this test can be used) for the duration of the COVID-19 declaration under Section 564(b)(1) of the Act, 21 U.S.C. section 360bbb-3(b)(1), unless the authorization is terminated or revoked.  Performed at [facility], [address] [place], [place] [address]      Labs: Basic Metabolic Panel: Recent Labs  Lab 05/04/21 0544 05/05/21 0406 05/06/21 0601 05/07/21 0635 05/08/21 0612 05/09/21 0519  NA 137 135 137 136 137 138  K 3.5 3.6 3.4* 3.5 3.7 4.2  CL 106 104 103 103 104 104  CO2 24 23 24 27 26 27   GLUCOSE 103* 110* 103* 102* 96 102*  BUN 5* 7 12 10 14 14   CREATININE 0.83 0.84 0.94 1.01 1.09 1.12  CALCIUM 9.0 9.6 9.7 9.3 9.4 9.4  MG 1.8  --  1.9 2.0 1.9 1.9  PHOS  --   --  5.4* 4.6 4.5 4.3   Liver Function Tests: Recent Labs  Lab 05/05/21 0406 05/06/21 0601 05/07/21 0635 05/08/21 0612 05/09/21 0519  AST 75* 57* 50* 37 33  ALT 59* 54* 49* 44 38  ALKPHOS 108 100 91 83 83  BILITOT 0.5 0.5 0.5 0.4 0.5  PROT 7.1 7.0 7.0 6.7 6.5  ALBUMIN 3.7 3.5 3.5 3.4* 3.3*   Recent Labs  Lab 05/06/21 0601 05/07/21 0635 05/08/21 0612 05/09/21 0519  LIPASE 102* 95* 82* 65*   Recent Labs  Lab 05/03/21 1025  AMMONIA 18   CBC: Recent Labs  Lab 05/05/21 0406 05/06/21 0601 05/07/21 0635 05/08/21 0612 05/09/21 0519  WBC 5.2 4.9 5.7 5.3 5.4  NEUTROABS  --  3.2 3.2 2.6 2.8  HGB 11.0* 10.7* 10.1* 10.0* 9.9*  HCT 32.4* 31.6* 30.9* 31.1* 30.2*  MCV 86.2 87.3 87.8 88.6 90.1  PLT 168 170 202 238 253   Cardiac Enzymes: No results for input(s): CKTOTAL, CKMB, CKMBINDEX, TROPONINI in the last 168 hours. BNP: BNP (last 3 results) No results for input(s): BNP in the last 8760 hours.  ProBNP (last 3 results) No results for input(s): PROBNP in the last 8760 hours.  CBG: Recent Labs  Lab 05/03/21 0814 05/04/21 0726  05/04/21 0740 05/04/21 1140  GLUCAP 94 109* 95 107*       Signed:  Carolyne Littles, MD [facility]

## 2021-09-29 ENCOUNTER — Encounter: Payer: Self-pay | Admitting: Gastroenterology

## 2021-09-30 ENCOUNTER — Ambulatory Visit
Admission: RE | Admit: 2021-09-30 | Discharge: 2021-09-30 | Disposition: A | Discharge status: Home / Self Care | Payer: Medicare Other | Attending: Gastroenterology | Admitting: Gastroenterology

## 2021-09-30 ENCOUNTER — Ambulatory Visit: Payer: Medicare Other | Admitting: Certified Registered Nurse Anesthetist

## 2021-09-30 ENCOUNTER — Encounter: Payer: Self-pay | Admitting: Gastroenterology

## 2021-09-30 ENCOUNTER — Encounter
Admission: RE | Disposition: A | Discharge status: Home / Self Care | Payer: Self-pay | Source: Home / Self Care | Attending: Gastroenterology

## 2021-09-30 DIAGNOSIS — K921 Melena: Secondary | ICD-10-CM | POA: Insufficient documentation

## 2021-09-30 DIAGNOSIS — K2270 Barrett's esophagus without dysplasia: Secondary | ICD-10-CM | POA: Diagnosis not present

## 2021-09-30 DIAGNOSIS — K635 Polyp of colon: Secondary | ICD-10-CM | POA: Insufficient documentation

## 2021-09-30 DIAGNOSIS — K449 Diaphragmatic hernia without obstruction or gangrene: Secondary | ICD-10-CM | POA: Insufficient documentation

## 2021-09-30 DIAGNOSIS — Z09 Encounter for follow-up examination after completed treatment for conditions other than malignant neoplasm: Secondary | ICD-10-CM | POA: Diagnosis not present

## 2021-09-30 DIAGNOSIS — Z8719 Personal history of other diseases of the digestive system: Secondary | ICD-10-CM | POA: Insufficient documentation

## 2021-09-30 DIAGNOSIS — K2100 Gastro-esophageal reflux disease with esophagitis, without bleeding: Secondary | ICD-10-CM | POA: Insufficient documentation

## 2021-09-30 DIAGNOSIS — K640 First degree hemorrhoids: Secondary | ICD-10-CM | POA: Insufficient documentation

## 2021-09-30 DIAGNOSIS — K319 Disease of stomach and duodenum, unspecified: Secondary | ICD-10-CM | POA: Insufficient documentation

## 2021-09-30 HISTORY — PX: ESOPHAGOGASTRODUODENOSCOPY (EGD) WITH PROPOFOL: SHX5813

## 2021-09-30 HISTORY — DX: Alcohol induced acute pancreatitis without necrosis or infection: K85.20

## 2021-09-30 HISTORY — PX: COLONOSCOPY WITH PROPOFOL: SHX5780

## 2021-09-30 HISTORY — DX: Inflammatory liver disease, unspecified: K75.9

## 2021-09-30 SURGERY — COLONOSCOPY WITH PROPOFOL
Anesthesia: General

## 2021-09-30 MED ORDER — MIDAZOLAM HCL 2 MG/2ML IJ SOLN
INTRAMUSCULAR | Status: AC
  Filled 2021-09-30: qty 2

## 2021-09-30 MED ORDER — PROPOFOL 500 MG/50ML IV EMUL
INTRAVENOUS | Status: DC | PRN
  Administered 2021-09-30: 150 ug/kg/min via INTRAVENOUS

## 2021-09-30 MED ORDER — LIDOCAINE HCL (CARDIAC) PF 100 MG/5ML IV SOSY
PREFILLED_SYRINGE | INTRAVENOUS | Status: DC | PRN
  Administered 2021-09-30: 50 mg via INTRAVENOUS

## 2021-09-30 MED ORDER — LABETALOL HCL 5 MG/ML IV SOLN
INTRAVENOUS | Status: DC | PRN
  Administered 2021-09-30: 5 mg via INTRAVENOUS

## 2021-09-30 MED ORDER — LABETALOL HCL 5 MG/ML IV SOLN
INTRAVENOUS | Status: AC
  Filled 2021-09-30: qty 4

## 2021-09-30 MED ORDER — MIDAZOLAM HCL 2 MG/2ML IJ SOLN
INTRAMUSCULAR | Status: DC | PRN
  Administered 2021-09-30 (×2): 1 mg via INTRAVENOUS

## 2021-09-30 MED ORDER — PROPOFOL 10 MG/ML IV BOLUS
INTRAVENOUS | Status: DC | PRN
  Administered 2021-09-30: 40 mg via INTRAVENOUS
  Administered 2021-09-30: 60 mg via INTRAVENOUS

## 2021-09-30 MED ORDER — PROPOFOL 500 MG/50ML IV EMUL
INTRAVENOUS | Status: AC
  Filled 2021-09-30: qty 50

## 2021-09-30 MED ORDER — SODIUM CHLORIDE 0.9 % IV SOLN
INTRAVENOUS | Status: DC
Start: 2021-09-30 — End: 2021-09-30
  Administered 2021-09-30: 1000 mL via INTRAVENOUS

## 2021-09-30 NOTE — Op Note (Signed)
[facility] [place] ?Gastroenterology ?Patient Name: Ronald Watts ?Procedure Date: 09/30/2021 8:41 AM ?MRN: [id number] ?Account #: [id number] ?Date of Birth: 1965-09-23 ?Admit Type: Outpatient ?Age: 56 ?Room: [facility] ENDO ROOM 2 ?Gender: Male ?Note Status: Finalized ?Instrument Name: Upper Endoscope [id number] ?Procedure:             Upper GI endoscopy ?Indications:           Surveillance for malignancy due to personal history of  ?                       Barrett's esophagus ?Providers:             Annamaria Helling DO, DO ?Referring MD:          [name], MD (Referring MD) ?Medicines:             Monitored Anesthesia Care ?Complications:         No immediate complications. Estimated blood loss:  ?                       Minimal. ?Procedure:             Pre-Anesthesia Assessment: ?                       - Prior to the procedure, a History and Physical was  ?                       performed, and patient medications and allergies were  ?                       reviewed. The patient is competent. The risks and  ?                       benefits of the procedure and the sedation options and  ?                       risks were discussed with the patient. All questions  ?                       were answered and informed consent was obtained.  ?                       Patient identification and proposed procedure were  ?                       verified by the physician, the nurse, the anesthetist  ?                       and the technician in the endoscopy suite. Mental  ?                       Status Examination: alert and oriented. Airway  ?                       Examination: normal oropharyngeal airway and neck  ?                       mobility. Respiratory Examination: clear to  ?  auscultation. CV Examination: RRR, no murmurs, no S3  ?                       or S4. Prophylactic Antibiotics: The patient does not  ?                       require prophylactic antibiotics. Prior  ?                        Anticoagulants: The patient has taken no previous  ?                       anticoagulant or antiplatelet agents. ASA Grade  ?                       Assessment: III - A patient with severe systemic  ?                       disease. After reviewing the risks and benefits, the  ?                       patient was deemed in satisfactory condition to  ?                       undergo the procedure. The anesthesia plan was to use  ?                       monitored anesthesia care (MAC). Immediately prior to  ?                       administration of medications, the patient was  ?                       re-assessed for adequacy to receive sedatives. The  ?                       heart rate, respiratory rate, oxygen saturations,  ?                       blood pressure, adequacy of pulmonary ventilation, and  ?                       response to care were monitored throughout the  ?                       procedure. The physical status of the patient was  ?                       re-assessed after the procedure. ?                       After obtaining informed consent, the endoscope was  ?                       passed under direct vision. Throughout the procedure,  ?                       the patient's blood pressure, pulse, and oxygen  ?  saturations were monitored continuously. The Endoscope  ?                       was introduced through the mouth, and advanced to the  ?                       second part of duodenum. The upper GI endoscopy was  ?                       accomplished without difficulty. The patient tolerated  ?                       the procedure well. ?Findings: ?     The duodenal bulb, first portion of the duodenum and second portion of  ?     the duodenum were normal. Estimated blood loss: none. ?     A 4 cm hiatal hernia was present. Estimated blood loss: none. ?     Normal mucosa was found in the entire examined stomach. Biopsies were  ?     taken with a cold forceps for Helicobacter  pylori testing. Estimated  ?     blood loss was minimal. ?     Esophagogastric landmarks were identified: the gastroesophageal junction  ?     was found at 40 cm from the incisors. ?     There were esophageal mucosal changes secondary to established  ?     short-segment Barrett's disease, classified as Barrett's stage C0-M2 per  ?     [place] criteria present in the distal esophagus. The maximum  ?     longitudinal extent of these mucosal changes was 2 cm in length. Mucosa  ?     was biopsied with a cold forceps for histology randomly in the lower  ?     third of the esophagus. One specimen bottle was sent to pathology.  ?     Estimated blood loss was minimal. ?     Diffuse, white plaques were found in the entire esophagus. Biopsies were  ?     taken with a cold forceps for histology. Estimated blood loss was  ?     minimal. ?     Abnormal motility was noted in the esophagus. There is spasticity of the  ?     esophageal body. The distal esophagus/lower esophageal sphincter is  ?     open. Estimated blood loss: none. ?     The exam of the esophagus was otherwise normal. ?Impression:            - Normal duodenal bulb, first portion of the duodenum  ?                       and second portion of the duodenum. ?                       - 4 cm hiatal hernia. ?                       - Normal mucosa was found in the entire stomach.  ?                       Biopsied. ?                       -  Esophagogastric landmarks identified. ?                       - Esophageal mucosal changes secondary to established  ?                       short-segment Barrett's disease, classified as  ?                       Barrett's stage C0-M2 per [place] criteria. Biopsied. ?                       - Esophageal plaques were found, consistent with  ?                       candidiasis. Biopsied. ?                       - Abnormal esophageal motility, suspicious for  ?                       esophageal spasm. ?Recommendation:        - Discharge patient  to home. ?                       - Resume previous diet. ?                       - Continue present medications. ?                       - Diflucan (fluconazole) 200 mg PO daily. ?                       - Await pathology results. ?                       - Repeat upper endoscopy in 3 years for surveillance  ?                       of Barrett's esophagus. ?                       - Return to GI office as previously scheduled. ?                       - The findings and recommendations were discussed with  ?                       the patient. ?Procedure Code(s):     --- Professional --- ?                       [phone], Esophagogastroduodenoscopy, flexible,  ?                       transoral; with biopsy, single or multiple ?Diagnosis Code(s):     --- Professional --- ?                       K22.70, Barrett's esophagus without dysplasia ?  K44.9, Diaphragmatic hernia without obstruction or  ?                       gangrene ?                       K22.9, Disease of esophagus, unspecified ?                       K22.4, Dyskinesia of esophagus ?CPT copyright 2019 [employer]. All rights reserved. ?The codes documented in this report are preliminary and upon coder review may  ?be revised to meet current compliance requirements. ?Attending Participation: ?     I personally performed the entire procedure. ?Volney American, DO ?Annamaria Helling DO, DO ?09/30/2021 9:08:35 AM ?This report has been signed electronically. ?Number of Addenda: 0 ?Note Initiated On: 09/30/2021 8:41 AM ?Estimated Blood Loss:  Estimated blood loss was minimal. ?     [facility] ?

## 2021-09-30 NOTE — Anesthesia Preprocedure Evaluation (Signed)
Anesthesia Evaluation  ?Patient identified by MRN, date of birth, ID band ?Patient awake ? ? ? ?Reviewed: ?Allergy & Precautions, NPO status , Patient's Chart, lab work & pertinent test results ? ?History of Anesthesia Complications ?Negative for: history of anesthetic complications ? ?Airway ?Mallampati: II ? ?TM Distance: >3 FB ?Neck ROM: Full ? ? ? Dental ? ?(+) Poor Dentition, Chipped, Missing ?  ?Pulmonary ?neg sleep apnea, COPD, Current Smoker and Patient abstained from smoking.,  ?  ?Pulmonary exam normal ?breath sounds clear to auscultation ? ? ? ? ? ? Cardiovascular ?Exercise Tolerance: Good ?METShypertension, Pt. on medications ?(-) CAD and (-) Past MI (-) dysrhythmias  ?Rhythm:Regular Rate:Normal ?- Systolic murmurs ? ?  ?Neuro/Psych ?Ocular strokes; blindness in both eyes ?CVA, Residual Symptoms negative psych ROS  ? GI/Hepatic ?GERD  ,(+)  ?  ? substance abuse ? alcohol use and marijuana use,   ?Endo/Other  ?neg diabetes ? Renal/GU ?negative Renal ROS  ? ?  ?Musculoskeletal ? ? Abdominal ?  ?Peds ? Hematology ? 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 Reproductive/Obstetrics ? ?  ? ? ? ? ? ? ? ? ? ? ? ? ? ?  ?  ? ? ? ? ? ? ? ? ?Anesthesia Physical ?Anesthesia Plan ? ?ASA: 3 ? ?Anesthesia Plan: General  ? ?Post-op Pain Management: Minimal or no pain anticipated  ? ?Induction: Intravenous ? ?PONV Risk Score and Plan: 1 and Propofol infusion, TIVA and Ondansetron ? ?Airway Management Planned:  Nasal Cannula ? ?Additional Equipment: None ? ?Intra-op Plan:  ? ?Post-operative Plan:  ? ?Informed Consent: I have reviewed the patients History and Physical, chart, labs and discussed the procedure including the risks, benefits and alternatives for the proposed anesthesia with the patient or authorized representative who has indicated his/her understanding and acceptance.  ? ? ? ?Dental advisory given ? ?Plan Discussed with: CRNA and Surgeon ? ?Anesthesia Plan Comments: (Discussed risks of anesthesia with patient, including possibility of difficulty with spontaneous ventilation under anesthesia necessitating airway intervention, PONV, and rare risks such as cardiac or respiratory or neurological events, and allergic reactions. Discussed the role of CRNA in patient's perioperative care. Patient understands. ?Patient counseled on benefits of smoking cessation, and increased perioperative risks associated with continued smoking. ?)  ? ? ? ? ? ? ?Anesthesia Quick Evaluation ? ?

## 2021-09-30 NOTE — Interval H&P Note (Signed)
History and Physical Interval Note: Preprocedure H&P from 09/30/21 ? was reviewed and there was no interval change after seeing and examining the patient.  Written consent was obtained from the patient after discussion of risks, benefits, and alternatives. Patient has consented to proceed with Esophagogastroduodenoscopy and Colonoscopy with possible intervention ? ? ?09/30/2021 ?8:46 AM ? ?Ronald Watts  has presented today for surgery, with the diagnosis of Rectal bleeding K62.5 ?History of Barrett's Esophagus Z87.19.  The various methods of treatment have been discussed with the patient and family. After consideration of risks, benefits and other options for treatment, the patient has consented to  Procedure(s): ?COLONOSCOPY WITH PROPOFOL (N/A) ?ESOPHAGOGASTRODUODENOSCOPY (EGD) WITH PROPOFOL (N/A) as a surgical intervention.  The patient's history has been reviewed, patient examined, no change in status, stable for surgery.  I have reviewed the patient's chart and labs.  Questions were answered to the patient's satisfaction.   ? ? ?[name] ? ? ?

## 2021-09-30 NOTE — Transfer of Care (Signed)
Immediate Anesthesia Transfer of Care Note ? ?Patient: Ronald Watts ? ?Procedure(s) Performed: COLONOSCOPY WITH PROPOFOL ?ESOPHAGOGASTRODUODENOSCOPY (EGD) WITH PROPOFOL ? ?Patient Location: PACU ? ?Anesthesia Type:General ? ?Level of Consciousness: sedated ? ?Airway & Oxygen Therapy: Patient Spontanous Breathing ? ?Post-op Assessment: Report given to RN and Post -op Vital signs reviewed and stable ? ?Post vital signs: Reviewed and stable ? ?Last Vitals:  ?Vitals Value Taken Time  ?BP 122/93 09/30/21 0934  ?Temp 36.4 ?C 09/30/21 0930  ?Pulse 67 09/30/21 0930  ?Resp 18 09/30/21 0934  ?SpO2 100 % 09/30/21 0930  ? ? ?Last Pain:  ?Vitals:  ? 09/30/21 0930  ?TempSrc: Temporal  ?PainSc:   ?   ? ?  ? ?Complications: No notable events documented. ?

## 2021-09-30 NOTE — Op Note (Signed)
[facility] ?Gastroenterology ?Patient Name: Ronald Watts ?Procedure Date: 09/30/2021 8:40 AM ?MRN: [id number] ?Account #: [id number] ?Date of Birth: [birth year] ?Admit Type: Outpatient ?Age: 56 ?Room: [facility] ENDO ROOM 2 ?Gender: Male ?Note Status: Finalized ?Instrument Name: Colonoscope [id number] ?Procedure:             Colonoscopy ?Indications:           Hematochezia ?Providers:             Annamaria Helling DO, DO ?Referring MD:          [name], MD (Referring MD) ?Medicines:             Monitored Anesthesia Care ?Complications:         No immediate complications. Estimated blood loss:  ?                       Minimal. ?Procedure:             Pre-Anesthesia Assessment: ?                       - Prior to the procedure, a History and Physical was  ?                       performed, and patient medications and allergies were  ?                       reviewed. The patient is competent. The risks and  ?                       benefits of the procedure and the sedation options and  ?                       risks were discussed with the patient. All questions  ?                       were answered and informed consent was obtained.  ?                       Patient identification and proposed procedure were  ?                       verified by the physician, the nurse, the anesthetist  ?                       and the technician in the endoscopy suite. Mental  ?                       Status Examination: alert and oriented. Airway  ?                       Examination: normal oropharyngeal airway and neck  ?                       mobility. Respiratory Examination: clear to  ?                       auscultation. CV Examination: RRR, no murmurs, no S3  ?  or S4. Prophylactic Antibiotics: The patient does not  ?                       require prophylactic antibiotics. Prior  ?                       Anticoagulants: The patient has taken no previous  ?                       anticoagulant or  antiplatelet agents. ASA Grade  ?                       Assessment: III - A patient with severe systemic  ?                       disease. After reviewing the risks and benefits, the  ?                       patient was deemed in satisfactory condition to  ?                       undergo the procedure. The anesthesia plan was to use  ?                       monitored anesthesia care (MAC). Immediately prior to  ?                       administration of medications, the patient was  ?                       re-assessed for adequacy to receive sedatives. The  ?                       heart rate, respiratory rate, oxygen saturations,  ?                       blood pressure, adequacy of pulmonary ventilation, and  ?                       response to care were monitored throughout the  ?                       procedure. The physical status of the patient was  ?                       re-assessed after the procedure. ?                       After obtaining informed consent, the colonoscope was  ?                       passed under direct vision. Throughout the procedure,  ?                       the patient's blood pressure, pulse, and oxygen  ?                       saturations were monitored continuously. The  ?  Colonoscope was introduced through the anus and  ?                       advanced to the the terminal ileum, with  ?                       identification of the appendiceal orifice and IC  ?                       valve. The colonoscopy was performed without  ?                       difficulty. The patient tolerated the procedure well.  ?                       The quality of the bowel preparation was evaluated  ?                       using the BBPS [facility] Bowel Preparation Scale) with  ?                       scores of: Right Colon = 3 (entire mucosa seen well  ?                       with no residual staining, small fragments of stool or  ?                       opaque liquid), Transverse  Colon = 3 (entire mucosa  ?                       seen well with no residual staining, small fragments  ?                       of stool or opaque liquid) and Left Colon = 2 (minor  ?                       amount of residual staining, small fragments of stool  ?                       and/or opaque liquid, but mucosa seen well). The total  ?                       BBPS score equals 8. The quality of the bowel  ?                       preparation was excellent. The terminal ileum,  ?                       ileocecal valve, appendiceal orifice, and rectum were  ?                       photographed. ?Findings: ?     The perianal and digital rectal examinations were normal. Pertinent  ?     negatives include normal sphincter tone. ?     The terminal ileum appeared normal. Estimated blood loss: none. ?     A 1 to 2 mm polyp was found in the  rectum. The polyp was sessile. The  ?     polyp was removed with a cold biopsy forceps. Resection and retrieval  ?     were complete. Estimated blood loss was minimal. ?     Non-bleeding internal hemorrhoids were found during retroflexion. The  ?     hemorrhoids were Grade I (internal hemorrhoids that do not prolapse).  ?     Estimated blood loss: none. ?     The exam was otherwise without abnormality on direct and retroflexion  ?     views. ?Impression:            - The examined portion of the ileum was normal. ?                       - One 1 to 2 mm polyp in the rectum, removed with a  ?                       cold biopsy forceps. Resected and retrieved. ?                       - Non-bleeding internal hemorrhoids. ?                       - The examination was otherwise normal on direct and  ?                       retroflexion views. ?Recommendation:        - Discharge patient to home. ?                       - Resume previous diet. ?                       - Continue present medications. ?                       - No aspirin, ibuprofen, naproxen, or other  ?                        non-steroidal anti-inflammatory drugs for 5 days after  ?                       polyp removal. ?                       - Await pathology results. ?                       - Repeat colonoscopy for surveillance based on  ?                       pathology results. ?                       - Return to referring physician as previously  ?                       scheduled. ?                       - The findings and recommendations were discussed with  ?  the patient. ?Procedure Code(s):     --- Professional --- ?                       [phone], Colonoscopy, flexible; with biopsy, single or  ?                       multiple ?Diagnosis Code(s):     --- Professional --- ?                       K64.0, First degree hemorrhoids ?                       K62.1, Rectal polyp ?                       K92.1, Melena (includes Hematochezia) ?CPT copyright 2019 American Medical Association. All rights reserved. ?The codes documented in this report are preliminary and upon coder review may  ?be revised to meet current compliance requirements. ?Attending Participation: ?     I personally performed the entire procedure. ?Volney American, DO ?Annamaria Helling DO, DO ?09/30/2021 9:35:21 AM ?This report has been signed electronically. ?Number of Addenda: 0 ?Note Initiated On: 09/30/2021 8:40 AM ?Scope Withdrawal Time: 0 hours 14 minutes 23 seconds  ?Total Procedure Duration: 0 hours 19 minutes 31 seconds  ?Estimated Blood Loss:  Estimated blood loss was minimal. ?     [facility] ?

## 2021-09-30 NOTE — Anesthesia Procedure Notes (Signed)
Date/Time: 09/30/2021 8:49 AM ?Performed by: Ginger Carne, CRNA ?Pre-anesthesia Checklist: Patient identified, Emergency Drugs available, Suction available, Patient being monitored and Timeout performed ?Patient Re-evaluated:Patient Re-evaluated prior to induction ?Oxygen Delivery Method: Nasal cannula ?Preoxygenation: Pre-oxygenation with 100% oxygen ?Induction Type: IV induction ? ? ? ? ?

## 2021-09-30 NOTE — Anesthesia Postprocedure Evaluation (Signed)
Anesthesia Post Note ? ?Patient: Ronald Watts ? ?Procedure(s) Performed: COLONOSCOPY WITH PROPOFOL ?ESOPHAGOGASTRODUODENOSCOPY (EGD) WITH PROPOFOL ? ?Patient location during evaluation: Endoscopy ?Anesthesia Type: General ?Level of consciousness: awake and alert ?Pain management: pain level controlled ?Vital Signs Assessment: post-procedure vital signs reviewed and stable ?Respiratory status: spontaneous breathing, nonlabored ventilation, respiratory function stable and patient connected to nasal cannula oxygen ?Cardiovascular status: blood pressure returned to baseline and stable ?Postop Assessment: no apparent nausea or vomiting ?Anesthetic complications: no ? ? ?No notable events documented. ? ? ?Last Vitals:  ?Vitals:  ? 09/30/21 0950 09/30/21 1000  ?BP:  (!) 143/105  ?Pulse: 63 66  ?Resp: 13 14  ?Temp:    ?SpO2: 99% 98%  ?  ?Last Pain:  ?Vitals:  ? 09/30/21 0930  ?TempSrc: Temporal  ?PainSc:   ? ? ?  ?  ?  ?  ?  ?  ? ?Corinda Gubler ? ? ? ? ?

## 2021-09-30 NOTE — H&P (Signed)
? ?Pre-Procedure H&P ?  ?Patient ID: Ronald Watts is a 56 y.o. male. ? ?Gastroenterology Provider: Jaynie CollinsSteven Michael Mirelle Biskup, DO ? ?Referring Provider: Jacob MooresMason Croley, PA ?PCP: Ronald Watts, Ronald M, MD ? ?Date: 09/30/2021 ? ?HPI ?[name] is a 56 y.o. male who presents today for Esophagogastroduodenoscopy and Colonoscopy for surveillance Barrett's esophagus, Bright red blood per rectum. ? ?No dysphagia/odynophagia. H/o etoh use. Ppi controls acid reflux. Denies weight loss. 2 ppd tobacco and also uses THC.  ? ?EGD and Colonoscopy in 2003 and 2006 (both each time)- IH and candida demonstrated back in 2003. ? ?Still with intermittent blood per rectum. ? ?Difficulty with urinating at times ? ?No other acute gi complaints. ? ?Past Medical History:  ?Diagnosis Date  ? Alcohol-induced acute pancreatitis   ? Anemia   ? Barrett's syndrome   ? COPD (chronic obstructive pulmonary disease) (HCC)   ? ED (erectile dysfunction)   ? GERD (gastroesophageal reflux disease)   ? Hepatitis   ? History of blood transfusion   ? HLD (hyperlipidemia)   ? Hypertension   ? Knee pain, right   ? Sciatica   ? Stroke [facility](HCC)   ? optic nerve  ? ? ?Past Surgical History:  ?Procedure Laterality Date  ? COLONOSCOPY    ? CYST EXCISION    ? throat  ? HEMORRHOID BANDING    ? OLECRANON BURSECTOMY Right 01/29/2018  ? Procedure: OLECRANON BURSECTOMY AND  ELBOW DEBRIDMENT;  Surgeon: Signa KellPatel, Sunny, MD;  Location: [facility] ORS;  Service: Orthopedics;  Laterality: Right;  ? UPPER GI ENDOSCOPY    ? ? ?Family History ?No h/o GI disease or malignancy ? ?Review of Systems  ?Constitutional:  Negative for activity change, appetite change, chills, diaphoresis, fatigue, fever and unexpected weight change.  ?HENT:  Negative for trouble swallowing and voice change.   ?Respiratory:  Negative for shortness of breath and wheezing.   ?Cardiovascular:  Negative for chest pain, palpitations and leg swelling.  ?Gastrointestinal:  Positive for abdominal pain (improving) and blood in stool.  Negative for abdominal distention, anal bleeding, constipation, diarrhea, nausea and vomiting.  ?Genitourinary:  Positive for difficulty urinating.  ?Musculoskeletal:  Negative for arthralgias and myalgias.  ?Skin:  Negative for color change and pallor.  ?Neurological:  Negative for dizziness, syncope and weakness.  ?Psychiatric/Behavioral:  Negative for confusion. The patient is not nervous/anxious.   ?All other systems reviewed and are negative.  ? ?Medications ?No current facility-administered medications on file prior to encounter.  ? ?Current Outpatient Medications on File Prior to Encounter  ?Medication Sig Dispense Refill  ? ascorbic acid (VITAMIN C) 250 MG tablet Take 1 tablet (250 mg total) by mouth daily. 30 tablet 0  ? chlordiazePOXIDE (LIBRIUM) 10 MG capsule Take 6 capsules (60 mg total) by mouth 3 (three) times daily. 90 capsule 0  ? cyclobenzaprine (FLEXERIL) 10 MG tablet Take 1 tablet by mouth at bedtime.     ? ferrous sulfate 325 (65 FE) MG tablet Take 1 tablet (325 mg total) by mouth every other day. 15 tablet 0  ? gabapentin (NEURONTIN) 100 MG capsule Take 1 capsule by mouth 3 (three) times daily.    ? hydrochlorothiazide (HYDRODIURIL) 12.5 MG tablet Take 1 tablet (12.5 mg total) by mouth daily. 30 tablet 0  ? ibuprofen (ADVIL) 800 MG tablet Take 800 mg by mouth every 8 (eight) hours as needed.    ? lisinopril (ZESTRIL) 40 MG tablet Take 1 tablet (40 mg total) by mouth daily. 30 tablet 0  ?  magnesium oxide (MAG-OX) 400 MG tablet Take 1 tablet by mouth 2 (two) times daily.    ? omeprazole (PRILOSEC) 20 MG capsule Take 1 capsule by mouth daily.    ? thiamine 100 MG tablet Take 1 tablet (100 mg total) by mouth daily. 30 tablet 0  ? atorvastatin (LIPITOR) 40 MG tablet Take 40 mg by mouth daily. (Patient not taking: Reported on 05/02/2021)    ? hydrocortisone (ANUSOL-HC) 25 MG suppository Place 1 suppository (25 mg total) rectally 2 (two) times daily. (Patient not taking: Reported on 09/30/2021) 14  suppository 0  ? nicotine (NICODERM CQ - DOSED IN MG/24 HOURS) 21 mg/24hr patch Place 1 patch (21 mg total) onto the skin daily. (Patient not taking: Reported on 09/30/2021) 28 patch 0  ? ondansetron (ZOFRAN ODT) 4 MG disintegrating tablet Take 1 tablet (4 mg total) by mouth every 8 (eight) hours as needed for nausea or vomiting. 20 tablet 0  ? sildenafil (VIAGRA) 100 MG tablet Take 100 mg by mouth daily as needed for erectile dysfunction.    ? traMADol (ULTRAM) 50 MG tablet Take 1 tablet (50 mg total) by mouth every 6 (six) hours as needed. (Patient not taking: Reported on 05/02/2021) 10 tablet 0  ? ? ?Pertinent medications related to GI and procedure were reviewed by me with the patient prior to the procedure ? ? ?Current Facility-Administered Medications:  ?  0.9 %  sodium chloride infusion, , Intravenous, Continuous, Ronald Collins, DO, Last Rate: 20 mL/hr at 09/30/21 0802, 1,000 mL at 09/30/21 0802 ?  ?  ? ?No Known Allergies ?Allergies were reviewed by me prior to the procedure ? ?Objective  ? ? ?Vitals:  ? 09/30/21 0747  ?BP: (!) 131/110  ?Pulse: 100  ?Resp: 20  ?Temp: (!) 97.5 ?F (36.4 ?C)  ?TempSrc: Temporal  ?SpO2: 100%  ?Weight: 75 kg  ?Height: 5\' 10"  (1.778 Watts)  ? ? ? ?Physical Exam ?Vitals and nursing note reviewed.  ?Constitutional:   ?   General: He is not in acute distress. ?   Appearance: Normal appearance. He is not ill-appearing, toxic-appearing or diaphoretic.  ?HENT:  ?   Head: Normocephalic and atraumatic.  ?   Nose: Nose normal.  ?   Mouth/Throat:  ?   Mouth: Mucous membranes are moist.  ?   Pharynx: Oropharynx is clear.  ?   Comments: Poor dentition ?Eyes:  ?   General: No scleral icterus. ?   Extraocular Movements: Extraocular movements intact.  ?Cardiovascular:  ?   Rate and Rhythm: Normal rate and regular rhythm.  ?   Heart sounds: Normal heart sounds. No murmur heard. ?  No friction rub. No gallop.  ?Pulmonary:  ?   Effort: Pulmonary effort is normal. No respiratory distress.  ?    Breath sounds: No wheezing, rhonchi or rales.  ?   Comments: Diminished bilaterally ?Abdominal:  ?   General: Abdomen is flat. Bowel sounds are normal. There is no distension.  ?   Palpations: Abdomen is soft.  ?   Tenderness: There is no abdominal tenderness. There is no guarding or rebound.  ?Musculoskeletal:  ?   Cervical back: Neck supple.  ?   Right lower leg: No edema.  ?   Left lower leg: No edema.  ?Skin: ?   General: Skin is warm and dry.  ?   Coloration: Skin is not jaundiced or pale.  ?Neurological:  ?   General: No focal deficit present.  ?   Mental Status:  He is alert and oriented to person, place, and time. Mental status is at baseline.  ?Psychiatric:     ?   Mood and Affect: Mood normal.     ?   Behavior: Behavior normal.     ?   Thought Content: Thought content normal.     ?   Judgment: Judgment normal.  ? ? ? ?Assessment:  ?[name] is a 56 y.o. male  who presents today for Esophagogastroduodenoscopy and Colonoscopy for surveillance Barrett's esophagus, Bright red blood per rectum. ? ?Plan:  ?Esophagogastroduodenoscopy and Colonoscopy with possible intervention today ? ?Esophagogastroduodenoscopy and Colonoscopy with possible biopsy, control of bleeding, polypectomy, and interventions as necessary has been discussed with the patient/patient representative. Informed consent was obtained from the patient/patient representative after explaining the indication, nature, and risks of the procedure including but not limited to death, bleeding, perforation, missed neoplasm/lesions, cardiorespiratory compromise, and reaction to medications. Opportunity for questions was given and appropriate answers were provided. Patient/patient representative has verbalized understanding is amenable to undergoing the procedure. ? ? ?Ronald Collins, DO  ?[facility] Gastroenterology ? ?Portions of the record may have been created with voice recognition software. Occasional wrong-word or 'sound-a-like'  substitutions may have occurred due to the inherent limitations of voice recognition software.  Read the chart carefully and recognize, using context, where substitutions may have occurred. ?

## 2021-10-01 ENCOUNTER — Encounter: Payer: Self-pay | Admitting: Gastroenterology

## 2021-10-01 LAB — SURGICAL PATHOLOGY

## 2021-11-04 ENCOUNTER — Ambulatory Visit
Admission: RE | Admit: 2021-11-04 | Discharge: 2021-11-04 | Disposition: A | Discharge status: Home / Self Care | Payer: Medicare Other | Source: Ambulatory Visit | Attending: Family Medicine | Admitting: Family Medicine

## 2021-11-04 ENCOUNTER — Other Ambulatory Visit: Payer: Self-pay | Admitting: Family Medicine

## 2021-11-04 DIAGNOSIS — M79671 Pain in right foot: Secondary | ICD-10-CM | POA: Diagnosis present

## 2022-02-17 ENCOUNTER — Ambulatory Visit (INDEPENDENT_AMBULATORY_CARE_PROVIDER_SITE_OTHER): Payer: Medicare Other | Admitting: Neurosurgery

## 2022-02-17 ENCOUNTER — Encounter: Payer: Self-pay | Admitting: Neurosurgery

## 2022-02-17 VITALS — BP 124/74 | Ht 70.0 in | Wt 169.0 lb

## 2022-02-17 DIAGNOSIS — G8929 Other chronic pain: Secondary | ICD-10-CM

## 2022-02-17 DIAGNOSIS — M5441 Lumbago with sciatica, right side: Secondary | ICD-10-CM | POA: Diagnosis not present

## 2022-02-17 DIAGNOSIS — M5416 Radiculopathy, lumbar region: Secondary | ICD-10-CM | POA: Diagnosis not present

## 2022-02-17 NOTE — Progress Notes (Signed)
Referring Physician:  Leanna Sato, MD [address] [place],  [place] [address]  Primary Physician:  Leanna Sato, MD  History of Present Illness: 02/17/2022 [name] is a 56 y.o with a history of HLD, HTN, GERD, ?  Alcoholism currently sober, and current tobacco abuse (1 pack per day) who is here today with a chief complaint of tenderness to years of low back pain that is worsened over the last year.  He describes pain that starts in his right low back and radiates down the posterior lateral aspect of his right leg and into his anterior shin and the top of his foot with associated burning into his big toe.  He states that this is worse with laying flat or standing upright.  He denies any particular inciting event.  He has not had any similar left-sided symptoms.  He is currently taking gabapentin, Advil, and has tried tramadol in the past without significant relief of his symptoms. Of note he is a pack per day smoker  Conservative measures:  Physical therapy: none   Multimodal medical therapy including regular antiinflammatories: as above  Injections: no epidural steroid injections  Past Surgery: no previous spine surgeries  Ronald Watts has no symptoms of cervical myelopathy.  The symptoms are causing a significant impact on the patient's life.   Review of Systems:  A 10 point review of systems is negative, except for the pertinent positives and negatives detailed in the HPI.  Past Medical History: Past Medical History:  Diagnosis Date   Alcohol-induced acute pancreatitis    Anemia    Barrett's syndrome    COPD (chronic obstructive pulmonary disease) (HCC)    ED (erectile dysfunction)    GERD (gastroesophageal reflux disease)    Hepatitis    History of blood transfusion    HLD (hyperlipidemia)    Hypertension    Knee pain, right    Sciatica    Stroke [facility])    optic nerve    Past Surgical History: Past Surgical History:  Procedure Laterality Date    COLONOSCOPY     COLONOSCOPY WITH PROPOFOL N/A 09/30/2021   Procedure: COLONOSCOPY WITH PROPOFOL;  Surgeon: [name], DO;  Location: [facility] ENDOSCOPY;  Service: Gastroenterology;  Laterality: N/A;   CYST EXCISION     throat   ESOPHAGOGASTRODUODENOSCOPY (EGD) WITH PROPOFOL N/A 09/30/2021   Procedure: ESOPHAGOGASTRODUODENOSCOPY (EGD) WITH PROPOFOL;  Surgeon: [name], DO;  Location: [facility] ENDOSCOPY;  Service: Gastroenterology;  Laterality: N/A;   HEMORRHOID BANDING     OLECRANON BURSECTOMY Right 01/29/2018   Procedure: OLECRANON BURSECTOMY AND  ELBOW DEBRIDMENT;  Surgeon: Signa Kell, MD;  Location: [facility] ORS;  Service: Orthopedics;  Laterality: Right;   UPPER GI ENDOSCOPY      Allergies: Allergies as of 02/17/2022   (No Known Allergies)    Medications: Outpatient Encounter Medications as of 02/17/2022  Medication Sig   ascorbic acid (VITAMIN C) 250 MG tablet Take 1 tablet (250 mg total) by mouth daily.   atorvastatin (LIPITOR) 40 MG tablet Take 40 mg by mouth daily. (Patient not taking: Reported on 05/02/2021)   chlordiazePOXIDE (LIBRIUM) 10 MG capsule Take 6 capsules (60 mg total) by mouth 3 (three) times daily.   cyclobenzaprine (FLEXERIL) 10 MG tablet Take 1 tablet by mouth at bedtime.    ferrous sulfate 325 (65 FE) MG tablet Take 1 tablet (325 mg total) by mouth every other day.   gabapentin (NEURONTIN) 100 MG capsule Take 1 capsule by  mouth 3 (three) times daily.   hydrochlorothiazide (HYDRODIURIL) 12.5 MG tablet Take 1 tablet (12.5 mg total) by mouth daily.   hydrocortisone (ANUSOL-HC) 25 MG suppository Place 1 suppository (25 mg total) rectally 2 (two) times daily. (Patient not taking: Reported on 09/30/2021)   ibuprofen (ADVIL) 800 MG tablet Take 800 mg by mouth every 8 (eight) hours as needed.   lisinopril (ZESTRIL) 40 MG tablet Take 1 tablet (40 mg total) by mouth daily.   magnesium oxide (MAG-OX) 400 MG tablet Take 1 tablet by mouth 2 (two) times daily.    nicotine (NICODERM CQ - DOSED IN MG/24 HOURS) 21 mg/24hr patch Place 1 patch (21 mg total) onto the skin daily. (Patient not taking: Reported on 09/30/2021)   omeprazole (PRILOSEC) 20 MG capsule Take 1 capsule by mouth daily.   ondansetron (ZOFRAN ODT) 4 MG disintegrating tablet Take 1 tablet (4 mg total) by mouth every 8 (eight) hours as needed for nausea or vomiting.   sildenafil (VIAGRA) 100 MG tablet Take 100 mg by mouth daily as needed for erectile dysfunction.   thiamine 100 MG tablet Take 1 tablet (100 mg total) by mouth daily.   traMADol (ULTRAM) 50 MG tablet Take 1 tablet (50 mg total) by mouth every 6 (six) hours as needed. (Patient not taking: Reported on 05/02/2021)   No facility-administered encounter medications on file as of 02/17/2022.    Social History: Social History   Tobacco Use   Smoking status: Every Day    Packs/day: 1.00    Years: 30.00    Total pack years: 30.00    Types: Cigarettes   Smokeless tobacco: Never  Vaping Use   Vaping Use: Never used  Substance Use Topics   Alcohol use: Yes    Alcohol/week: 6.0 standard drinks of alcohol    Types: 6 Cans of beer per week   Drug use: Yes    Types: Marijuana    Comment: sometimes    Family Medical History: Family History  Problem Relation Age of Onset   Cancer Father    Hypertension Father    Hypertension Mother    Diabetes Mother     Physical Examination:  Today's Vitals   02/17/22 0936  BP: 124/74  Weight: 76.7 kg  Height: 5\' 10"  (1.778 m)  PainSc: 8   PainLoc: Back   Body mass index is 24.25 kg/m.  General: Patient is well developed, well nourished, calm, collected, and in no apparent distress. Attention to examination is appropriate.  Psychiatric: Patient is non-anxious.  Head:  Pupils equal, round, and reactive to light.  ENT:  Oral mucosa appears well hydrated.  Neck:   Supple.  Respiratory: Patient is breathing without any difficulty.  Extremities: No edema.  Vascular: Palpable  dorsal pedal pulses.  Skin:   On exposed skin, there are no abnormal skin lesions.  NEUROLOGICAL:     Awake, alert, oriented to person, place, and time.  Speech is clear and fluent. Fund of knowledge is appropriate.   Cranial Nerves: Pupils equal round and reactive to light.  Facial tone is symmetric.  Facial sensation is symmetric.  ROM of spine: full.  Palpation of spine: non tender.    Strength: Side Biceps Triceps Deltoid Interossei Grip Wrist Ext. Wrist Flex.  R 5 5 5 5 5 5 5   L 5 5 5 5 5 5 5    Side Iliopsoas Quads Hamstring PF DF EHL  R 5 5 5 5 5 5   L 5 5 5 5  5  5   Reflexes are 1+ and symmetric at the biceps, triceps, brachioradialis, patella and achilles.   Hoffman's is absent.  Clonus is not present.  Toes are down-going.  Bilateral upper and lower extremity sensation is intact to light touch.    Ambulates with an antalgic gait  Medical Decision Making  Imaging: MRI L spine 05/06/21 IMPRESSION: Multilevel degenerative changes as detailed above. No high-grade canal stenosis. Foraminal narrowing is greatest at L5-S1. Potential exiting nerve root compression on the left at L3-L4 and bilaterally at L5-S1.     Electronically Signed   By: Guadlupe Spanish M.D.   On: 05/06/2021 12:42  I have personally reviewed the images and agree with the above interpretation.  Assessment and Plan: Ronald Watts is a pleasant 56 y.o. male with longstanding history of lumbosacral complaints with a year of worsening symptoms.  His MRI does show multilevel degenerative changes most severe at L5-S1 with significant disc space narrowing and right-sided foraminal stenosis.  This likely explains his symptoms.  I recommended that he attempt conservative treatment as he has not done so thus far.  Have placed a referral for physical therapy as well as referral to pain management for consideration of medication management and ESI's.  We discussed the importance of smoking cessation should he fail to  improve and be deemed a surgical candidate.  He states that he will work on this.  Will see him back in 8 to 10 weeks to evaluate his progress.  He was encouraged to cancel this appointment should he get significant relief from conservative management and not be interested in further consideration of surgical intervention.  He was encouraged to call the office in the interim with any questions or concerns.  He expressed understanding was in agreement with this plan.    Thank you for involving me in the care of this patient.   I spent a total of 35 minutes in both face-to-face and non-face-to-face activities for this visit on the date of this encounter including review of outside records, review of imaging, review of current symptoms, physical exam, documentation, and referral placement.   [facility]

## 2022-02-18 ENCOUNTER — Ambulatory Visit: Payer: Medicare Other | Admitting: Neurosurgery

## 2022-04-19 NOTE — Progress Notes (Deleted)
Follow-up note: Referring Physician:  Leanna Sato, MD [address] [place],  [place] [address]  Primary Physician:  Leanna Sato, MD  Chief Complaint:  ***  History of Present Illness: Ronald Watts is a 56 y.o. male with a history of HLD, HTN, GERD, ?  Alcoholism currently sober, and current tobacco abuse (1 pack per day***).   He saw Danielle on 02/17/22 with LBP and right leg pain. He has known multilevel degenerative changes most severe at L5-S1 with significant disc space narrowing and right-sided foraminal stenosis per MRI lumbar spine 05/06/21.  He was sent to PT and referred to pain management for consideration of ESI's at his last visit. He was also to work on smoking cessation.   He is here for follow up.     Review of Systems:  A 10 point review of systems is negative, except for *** and the pertinent positives and negatives detailed in the HPI.  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    Past Surgical History: Past Surgical History:  Procedure Laterality Date   COLONOSCOPY     COLONOSCOPY WITH PROPOFOL N/A 09/30/2021   Procedure: COLONOSCOPY WITH PROPOFOL;  Surgeon: [name], DO;  Location: [facility] ENDOSCOPY;  Service: Gastroenterology;  Laterality: N/A;   CYST EXCISION     throat   ESOPHAGOGASTRODUODENOSCOPY (EGD) WITH PROPOFOL N/A 09/30/2021   Procedure: ESOPHAGOGASTRODUODENOSCOPY (EGD) WITH PROPOFOL;  Surgeon: [name], DO;  Location: [facility] ENDOSCOPY;  Service: Gastroenterology;  Laterality: N/A;   HEMORRHOID BANDING     OLECRANON BURSECTOMY Right 01/29/2018    Procedure: OLECRANON BURSECTOMY AND  ELBOW DEBRIDMENT;  Surgeon: Signa Kell, MD;  Location: [facility] ORS;  Service: Orthopedics;  Laterality: Right;   UPPER GI ENDOSCOPY      Allergies: Allergies as of 04/21/2022   (No Known Allergies)    Medications: Outpatient Encounter Medications as of 04/21/2022  Medication Sig   atorvastatin (LIPITOR) 40 MG tablet Take 40 mg by mouth daily.   cyclobenzaprine (FLEXERIL) 10 MG tablet Take 1 tablet by mouth at bedtime.    ferrous sulfate 325 (65 FE) MG tablet Take 1 tablet (325 mg total) by mouth every other day.   gabapentin (NEURONTIN) 100 MG capsule Take 1 capsule by mouth 3 (three) times daily.   hydrochlorothiazide (HYDRODIURIL) 12.5 MG tablet Take 1 tablet (12.5 mg total) by mouth daily.   ibuprofen (ADVIL) 800 MG tablet Take 800 mg by mouth every 8 (eight) hours as needed.   lisinopril (ZESTRIL) 40 MG tablet Take 1 tablet (40 mg total) by mouth daily.   magnesium oxide (MAG-OX) 400 MG tablet Take 1 tablet by mouth 2 (two) times daily.   omeprazole (PRILOSEC) 20 MG capsule Take 1 capsule by mouth daily.   ondansetron (ZOFRAN ODT) 4 MG disintegrating tablet Take 1 tablet (4 mg total) by mouth every 8 (eight) hours as needed for nausea or vomiting.   sildenafil (VIAGRA) 100 MG tablet Take 100 mg by mouth daily as needed for erectile dysfunction.   No  facility-administered encounter medications on file as of 04/21/2022.    Social History: Social History   Tobacco Use   Smoking status: Every Day    Packs/day: 1.00    Years: 30.00    Total pack years: 30.00    Types: Cigarettes   Smokeless tobacco: Never  Vaping Use   Vaping Use: Never used  Substance Use Topics   Alcohol use: Yes    Alcohol/week: 6.0 standard drinks of alcohol    Types: 6 Cans of beer per week   Drug use: Yes    Types: Marijuana    Comment: sometimes    Family Medical History: Family History  Problem Relation Age of Onset   Cancer Father    Hypertension Father     Hypertension Mother    Diabetes Mother     Exam: @VITALWITHPAIN @  General: { :23897::"Dizziness","Lightheadedness","Shaking","Sweating"}.    Patient has *** gait.   *** ROM of spine with *** pain. *** tenderness in *** lumbar spine.   Strength in the lower extremities:  Left EHL 5/5   Right EHL 5/5 Left Dorsiflexion 5/5  Right Dorsiflexion 5/5 Left Plantar flexion 5/5 Right Plantar flexion 5/5 Left Hamstring 5/5  Right Hamstring 5/5 Left Quadriceps 5/5  Right Quadriceps 5/5  Reflexes are ***+ and symmetric at the patella and achilles.    Bilateral lower extremity sensation is intact to light touch.   Clonus is {positive_negative:25962}.     Imaging: ***  I have personally reviewed the images and agree with the above interpretation.  Assessment and Plan: [name] is a pleasant 56 y.o. male with ***  Above treatment options discussed with patient and following plan made:   - Order for physical therapy for lumbar spine ***. - Continue on current medications including ***. Reviewed proper dosing along with risks and benefits. Take and NSAIDs with food.   I spent a total of *** minutes in both face-to-face and non-face-to-face activities for this visit on the date of this encounter including ***  Geronimo Boot PA-C Neurosurgery

## 2022-04-21 ENCOUNTER — Ambulatory Visit: Payer: Medicare Other | Admitting: Orthopedic Surgery

## 2022-05-12 ENCOUNTER — Ambulatory Visit
Payer: Medicare Other | Attending: Student in an Organized Health Care Education/Training Program | Admitting: Student in an Organized Health Care Education/Training Program

## 2022-05-12 ENCOUNTER — Encounter: Payer: Self-pay | Admitting: Student in an Organized Health Care Education/Training Program

## 2022-05-12 VITALS — BP 140/96 | HR 75 | Temp 97.3°F | Resp 16 | Ht 70.0 in | Wt 161.0 lb

## 2022-05-12 DIAGNOSIS — M5416 Radiculopathy, lumbar region: Secondary | ICD-10-CM | POA: Diagnosis present

## 2022-05-12 DIAGNOSIS — M48061 Spinal stenosis, lumbar region without neurogenic claudication: Secondary | ICD-10-CM | POA: Insufficient documentation

## 2022-05-12 DIAGNOSIS — Z716 Tobacco abuse counseling: Secondary | ICD-10-CM | POA: Insufficient documentation

## 2022-05-12 DIAGNOSIS — M5136 Other intervertebral disc degeneration, lumbar region: Secondary | ICD-10-CM | POA: Diagnosis not present

## 2022-05-12 DIAGNOSIS — M47816 Spondylosis without myelopathy or radiculopathy, lumbar region: Secondary | ICD-10-CM | POA: Insufficient documentation

## 2022-05-12 DIAGNOSIS — M51369 Other intervertebral disc degeneration, lumbar region without mention of lumbar back pain or lower extremity pain: Secondary | ICD-10-CM | POA: Insufficient documentation

## 2022-05-12 DIAGNOSIS — G8929 Other chronic pain: Secondary | ICD-10-CM | POA: Insufficient documentation

## 2022-05-12 NOTE — Progress Notes (Signed)
Patient: Ronald Watts  Service Category: E/M  Provider: [name], MD  DOB: 1966-02-05  DOS: 05/12/2022  Referring Provider: Loleta Dicker, PA  MRN: [id number]  Setting: Ambulatory outpatient  PCP: [name], MD  Type: New Patient  Specialty: Interventional Pain Management    Location: Office  Delivery: Face-to-face     Primary Reason(s) for Visit: Encounter for initial evaluation of one or more chronic problems (new to examiner) potentially causing chronic pain, and posing a threat to normal musculoskeletal function. (Level of risk: High) CC: Back Pain (Lumbar right ) and Knee Pain (Right, reports torn ACL approx 15 years ago. )  HPI  Ronald Watts is a 56 y.o. year old, male patient, who comes for the first time to our practice referred by Loleta Dicker, PA for our initial evaluation of his chronic pain. 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 Today he comes in for evaluation of his Back Pain (Lumbar right ) and Knee Pain (Right, reports torn ACL approx 15 years ago. )  Pain Assessment: Location: Lower, Right Back Radiating: down right leg and wrapping around to shin area Onset: More than a month ago Duration: Chronic pain Quality: Discomfort, Constant, Other (Comment), Burning, Tingling (feels like a sprained ankle, feels like hot fluid melting down his back.  top of foot feels like it is on fire.) Severity: 4 /10 (subjective, self-reported pain score)  Effect on ADL: resting really helps but as he begins increased  activity has increased the pain.  difficult to stand in one place for very long. Timing: Constant Modifying factors: rest BP: (!) 140/96  HR: 75  Onset and Duration: Date of onset: 10 years ago  and Present longer than 3 months Cause of pain: Unknown Severity: No change since onset, NAS-11 at its worse: 10/10, NAS-11 at its best: 4/10, NAS-11 now: 4/10, and NAS-11 on the average: 6/10 Timing: Night Aggravating Factors: Bowel movements, Kneeling, Prolonged standing, and Walking Alleviating Factors: Sleeping Associated Problems: Night-time cramps, Dizziness, Fatigue, Inability to concentrate, Nausea, Numbness, Spasms, Sweating, Swelling, Tingling, Weakness, Pain that wakes patient up, and Pain that does not allow patient to sleep Quality of Pain: Aching, Annoying, Burning, Intermittent, Cramping, Disabling, Nagging, Sharp, Shooting, Stabbing, and Tingling Previous Examinations or Tests: X-rays and Neurological evaluation Previous Treatments: The patient denies na  Ronald Watts is a 56 year old man who presents with a chief complaint of axial low back pain with intermittent radiation into his right leg in a dermatomal fashion.  This has been going on for the last 10 years but has gotten worse over the last couple of months.  No inciting or traumatic event.  He states that about 10 years ago he noticed himself standing up from the commode where he felt a "pop in his back".  He states that since then he has had persistent low back pain.  Of note he has a history of alcoholism and has been sober for the last 6 months.  He smokes approximately 1 pack/day.  He does smoke marijuana occasionally.  He is on disability and does not work.  He has been evaluated by neurosurgery who recommended spinal injections.  He  has had a recent MRI.  He denies having done physical therapy for this condition.  He states that he has done physical therapy in the past and it was not helpful and actually made his pain worse.  Ronald Watts  was informed that I continue to offer evaluations and recommendations for medication management but I no longer take patients to write for their medications. I informed him that this visit is an evaluation only  about possible interventional pain management options. At that time he will have the opportunity to decide whether or not to proceed with those therapies. In the event that Mr. Weinreb decides not to go with those options, or prefers to stay away from interventional therapies, this will conclude our involvement in the case.   Historic Controlled Substance Pharmacotherapy Review   Historical Monitoring: The patient  reports current drug use. Drug: Marijuana. List of prior UDS Testing: Lab Results  Component Value Date   MDMA NONE DETECTED 01/29/2018   COCAINSCRNUR NONE DETECTED 01/29/2018   PCPSCRNUR NONE DETECTED 01/29/2018   THCU POSITIVE (A) 01/29/2018   Historical Background Evaluation: [place] PMP: PDMP reviewed during this encounter. Review of the past 41-month conducted.             [place] Department of public safety, offender search: (Editor, commissioningInformation) Non-contributory Risk Assessment Profile: Aberrant behavior: None observed or detected today Risk factors for fatal opioid overdose: family history of addiction, history of alcoholism, history of substance use disorder, liver disease, and nicotine dependence Fatal overdose hazard ratio (HR): Calculation deferred Non-fatal overdose hazard ratio (HR): Calculation deferred Risk of opioid abuse or dependence: 0.7-3.0% with doses ? 36 MME/day and 6.1-26% with doses ? 120 MME/day. Substance use disorder (SUD) risk level: See below Personal History of Substance Abuse (SUD-Substance use disorder):  Alcohol: Positive Male or Male  Illegal Drugs: Positive Male or Male  Rx Drugs: Negative  ORT Risk Level calculation: Moderate Risk  Opioid Risk Tool - 05/12/22 0903       Family History of Substance Abuse   Alcohol Negative    Illegal  Drugs Negative    Rx Drugs Negative      Personal History of Substance Abuse   Alcohol Positive Male or Male    Illegal Drugs Positive Male or Male    Rx Drugs Negative      Age   Age between 13-45years  No      Psychological Disease   Psychological Disease Negative    Depression Negative      Total Score   Opioid Risk Tool Scoring 7    Opioid Risk Interpretation Moderate Risk            ORT Scoring interpretation table:  Score <3 = Low Risk for SUD  Score between 4-7 = Moderate Risk for SUD  Score >8 = High Risk for Opioid Abuse   PHQ-2 Depression Scale:  Total score:    PHQ-2 Scoring interpretation table: (Score and probability of major depressive disorder)  Score 0 = No depression  Score 1 = 15.4% Probability  Score 2 = 21.1% Probability  Score 3 = 38.4% Probability  Score 4 = 45.5% Probability  Score 5 = 56.4% Probability  Score 6 = 78.6% Probability   PHQ-9 Depression Scale:  Total score:    PHQ-9 Scoring interpretation table:  Score 0-4 = No depression  Score 5-9 = Mild depression  Score 10-14 = Moderate depression  Score 15-19 = Moderately severe depression  Score 20-27 =  Severe depression (2.4 times higher risk of SUD and 2.89 times higher risk of overuse)   Pharmacologic Plan: Non-opioid analgesic therapy offered.  Interventional pain management only. Initial impression: Poor candidate for opioid analgesics.  Interventional pain management only.  Meds   Current Outpatient Medications:    cyclobenzaprine (FLEXERIL) 10 MG tablet, Take 1 tablet by mouth at bedtime. , Disp: , Rfl:    Eszopiclone 3 MG TABS, Take 3 mg by mouth at bedtime as needed., Disp: , Rfl:    gabapentin (NEURONTIN) 100 MG capsule, Take 1 capsule by mouth 3 (three) times daily., Disp: , Rfl:    gabapentin (NEURONTIN) 600 MG tablet, Take 600 mg by mouth 3 (three) times daily., Disp: , Rfl:    lisinopril (ZESTRIL) 40 MG tablet, Take 1 tablet (40 mg total) by mouth daily., Disp: 30  tablet, Rfl: 0   magnesium oxide (MAG-OX) 400 MG tablet, Take 1 tablet by mouth 2 (two) times daily., Disp: , Rfl:    omeprazole (PRILOSEC) 20 MG capsule, Take 1 capsule by mouth daily., Disp: , Rfl:    sildenafil (VIAGRA) 100 MG tablet, Take 100 mg by mouth daily as needed for erectile dysfunction., Disp: , Rfl:    traMADol (ULTRAM) 50 MG tablet, Take 50 mg by mouth every 6 (six) hours as needed for severe pain., Disp: , Rfl:    atorvastatin (LIPITOR) 40 MG tablet, Take 40 mg by mouth daily. (Patient not taking: Reported on 05/12/2022), Disp: , Rfl:    ferrous sulfate 325 (65 FE) MG tablet, Take 1 tablet (325 mg total) by mouth every other day. (Patient not taking: Reported on 05/12/2022), Disp: 15 tablet, Rfl: 0   hydrochlorothiazide (HYDRODIURIL) 12.5 MG tablet, Take 1 tablet (12.5 mg total) by mouth daily. (Patient not taking: Reported on 05/12/2022), Disp: 30 tablet, Rfl: 0   ibuprofen (ADVIL) 800 MG tablet, Take 800 mg by mouth every 8 (eight) hours as needed. (Patient not taking: Reported on 05/12/2022), Disp: , Rfl:    ondansetron (ZOFRAN ODT) 4 MG disintegrating tablet, Take 1 tablet (4 mg total) by mouth every 8 (eight) hours as needed for nausea or vomiting. (Patient not taking: Reported on 05/12/2022), Disp: 20 tablet, Rfl: 0  Imaging Review   DG Shoulder Right  Narrative CLINICAL DATA:  Right shoulder pain  EXAM: RIGHT SHOULDER - 2+ VIEW  COMPARISON:  None.  FINDINGS: Old fracture deformity of the right clavicle. No acute displaced fracture or malalignment. The right lung apex is clear  IMPRESSION: No acute osseous abnormality.  Old right clavicle fracture   Electronically Signed By: Donavan Foil M.D. On: 05/05/2021 20:39 MR Lumbar Spine W Wo Contrast  Narrative CLINICAL DATA:  Low back pain, > 6 wks, r/o nerve root compression  EXAM: MRI LUMBAR SPINE WITHOUT AND WITH CONTRAST  TECHNIQUE: Multiplanar and multiecho pulse sequences of the lumbar spine were obtained  without and with intravenous contrast.  CONTRAST:  65m GADAVIST GADOBUTROL 1 MMOL/ML IV SOLN  COMPARISON:  None.  FINDINGS: Segmentation:  Standard.  Alignment:  Multilevel trace retrolisthesis.  Mild dextrocurvature.  Vertebrae: Vertebral body heights are maintained apart from degenerative endplate irregularity at L5-S1. Degenerative endplate marrow changes are present at this level including mild edema.  Conus medullaris and cauda equina: Conus extends to the T12-L1 level. Conus and cauda equina appear normal. No abnormal intrathecal enhancement.  Paraspinal and other soft tissues: Unremarkable.  Disc levels:  L1-L2: Minimal disc bulge with trace left central extrusion extending slightly above disc  level. No canal or foraminal stenosis.  L2-L3: Disc bulge slightly eccentric to the right. No canal or foraminal stenosis.  L3-L4: Disc bulge with prominent foraminal components bilaterally. No canal stenosis. Slight effacement of the left subarticular recess. Mild right and mild to moderate left foraminal stenosis. Disc abuts and may compress the exiting left L3 nerve root.  L4-L5: Disc bulge with prominent foraminal components bilaterally. Facet arthropathy. No canal stenosis. Slight effacement of subarticular recesses. Moderate foraminal stenosis.  L5-S1: Disc bulge with endplate osteophytic ridging. Facet arthropathy. No canal stenosis. Moderate to marked foraminal stenosis. Disc/osteophyte abuts and may compress the exiting right greater than left L5 nerve roots.  IMPRESSION: Multilevel degenerative changes as detailed above. No high-grade canal stenosis. Foraminal narrowing is greatest at L5-S1. Potential exiting nerve root compression on the left at L3-L4 and bilaterally at L5-S1.   Electronically Signed By: Macy Mis M.D. On: 05/06/2021 12:42 DG Lumbar Spine 2-3 Views  Narrative CLINICAL DATA:  Low back pain  EXAM: LUMBAR SPINE - 2-3  VIEW  COMPARISON:  12/19/2018 report  FINDINGS: Mild dextrocurvature. Five non rib-bearing lumbar type vertebra. Lumbar sagittal alignment within normal limits. Vertebral body heights are maintained. Multilevel degenerative change with moderate disc space narrowing at L4-L5 and advanced degenerative changes at L5-S1. Facet degenerative changes of the lower lumbar spine. Mild degenerative changes elsewhere in the spine. Possible punctate left kidney stones.  IMPRESSION: Multilevel degenerative changes most advanced at L5-S1.   Electronically Signed By: Donavan Foil M.D. On: 05/05/2021 20:38   Foot Imaging: Foot-R DG Complete: Results for orders placed during the hospital encounter of 11/04/21  DG Foot Complete Right  Narrative CLINICAL DATA:  Right heel and ankle pain.  [place] in a hole years  EXAM: RIGHT FOOT COMPLETE - 3+ VIEW  COMPARISON:  None.  FINDINGS: Normal bone mineralization. Joint spaces are preserved. No acute fracture is seen. No dislocation.  IMPRESSION: Normal right foot radiographs.   Electronically Signed By: Yvonne Kendall M.D. On: 11/05/2021 12:05    Complexity Note: Imaging results reviewed.                         ROS  Cardiovascular: High blood pressure Pulmonary or Respiratory: Smoking Neurological: No reported neurological signs or symptoms such as seizures, abnormal skin sensations, urinary and/or fecal incontinence, being born with an abnormal open spine and/or a tethered spinal cord Psychological-Psychiatric: No reported psychological or psychiatric signs or symptoms such as difficulty sleeping, anxiety, depression, delusions or hallucinations (schizophrenial), mood swings (bipolar disorders) or suicidal ideations or attempts Gastrointestinal: Reflux or heatburn and Inflamed pancreas (Pancreatitis) Genitourinary: No reported renal or genitourinary signs or symptoms such as difficulty voiding or producing urine, peeing blood,  non-functioning kidney, kidney stones, difficulty emptying the bladder, difficulty controlling the flow of urine, or chronic kidney disease Hematological: Weakness due to low blood hemoglobin or red blood cell count (Anemia) Endocrine: No reported endocrine signs or symptoms such as high or low blood sugar, rapid heart rate due to high thyroid levels, obesity or weight gain due to slow thyroid or thyroid disease Rheumatologic: No reported rheumatological signs and symptoms such as fatigue, joint pain, tenderness, swelling, redness, heat, stiffness, decreased range of motion, with or without associated rash Musculoskeletal: Negative for myasthenia gravis, muscular dystrophy, multiple sclerosis or malignant hyperthermia Work History: Disabled  Allergies  Mr. Kader has No Known Allergies.  Laboratory Chemistry Profile   Renal Lab Results  Component Value Date  BUN 14 05/09/2021   CREATININE 1.12 05/09/2021   GFRNONAA >60 05/09/2021   PROTEINUR NEGATIVE 05/02/2021     Electrolytes Lab Results  Component Value Date   NA 138 05/09/2021   K 4.2 05/09/2021   CL 104 05/09/2021   CALCIUM 9.4 05/09/2021   MG 1.9 05/09/2021   PHOS 4.3 05/09/2021     Hepatic Lab Results  Component Value Date   AST 33 05/09/2021   ALT 38 05/09/2021   ALBUMIN 3.3 (L) 05/09/2021   ALKPHOS 83 05/09/2021   LIPASE 65 (H) 05/09/2021   AMMONIA 18 05/03/2021     ID Lab Results  Component Value Date   HIV Non Reactive 05/03/2021   SARSCOV2NAA NEGATIVE 05/02/2021   HCVAB NON REACTIVE 05/03/2021     Bone No results found for: "VD25OH", "VD125OH2TOT", "BP1025EN2", "DP8242PN3", "25OHVITD1", "25OHVITD2", "25OHVITD3", "TESTOFREE", "TESTOSTERONE"   Endocrine Lab Results  Component Value Date   GLUCOSE 102 (H) 05/09/2021   GLUCOSEU NEGATIVE 05/02/2021     Neuropathy Lab Results  Component Value Date   VITAMINB12 442 05/03/2021   FOLATE 18.8 05/03/2021   HIV Non Reactive 05/03/2021     CNS No  results found for: "COLORCSF", "APPEARCSF", "RBCCOUNTCSF", "WBCCSF", "POLYSCSF", "LYMPHSCSF", "EOSCSF", "PROTEINCSF", "GLUCCSF", "JCVIRUS", "CSFOLI", "IGGCSF", "LABACHR", "ACETBL"   Inflammation (CRP: Acute  ESR: Chronic) No results found for: "CRP", "ESRSEDRATE", "LATICACIDVEN"   Rheumatology No results found for: "RF", "ANA", "LABURIC", "URICUR", "LYMEIGGIGMAB", "LYMEABIGMQN", "HLAB27"   Coagulation Lab Results  Component Value Date   INR 0.9 05/02/2021   LABPROT 12.3 05/02/2021   APTT 34 05/02/2021   PLT 253 05/09/2021     Cardiovascular Lab Results  Component Value Date   HGB 9.9 (L) 05/09/2021   HCT 30.2 (L) 05/09/2021     Screening Lab Results  Component Value Date   SARSCOV2NAA NEGATIVE 05/02/2021   HCVAB NON REACTIVE 05/03/2021   HIV Non Reactive 05/03/2021     Cancer No results found for: "CEA", "CA125", "LABCA2"   Allergens No results found for: "ALMOND", "APPLE", "ASPARAGUS", "AVOCADO", "BANANA", "BARLEY", "BASIL", "BAYLEAF", "GREENBEAN", "LIMABEAN", "WHITEBEAN", "BEEFIGE", "REDBEET", "BLUEBERRY", "BROCCOLI", "CABBAGE", "MELON", "CARROT", "CASEIN", "CASHEWNUT", "CAULIFLOWER", "CELERY"     Note: Lab results reviewed.  PFSH  Drug: Mr. Standley  reports current drug use. Drug: Marijuana. Alcohol:  reports current alcohol use of about 6.0 standard drinks of alcohol per week. Tobacco:  reports that he has been smoking cigarettes. He has a 30.00 pack-year smoking history. He has never used smokeless tobacco. Medical:  has a past medical history of Alcohol-induced acute pancreatitis, Anemia, Barrett's syndrome, COPD (chronic obstructive pulmonary disease) ([place]), ED (erectile dysfunction), GERD (gastroesophageal reflux disease), Hepatitis, History of blood transfusion, HLD (hyperlipidemia), Hypertension, Knee pain, right, Sciatica, and Stroke ([place]). Family: family history includes Cancer in his father; Diabetes in his mother; Hypertension in his father and mother.  Past  Surgical History:  Procedure Laterality Date   COLONOSCOPY     COLONOSCOPY WITH PROPOFOL N/A 09/30/2021   Procedure: COLONOSCOPY WITH PROPOFOL;  Surgeon: Annamaria Helling, DO;  Location: [facility] ENDOSCOPY;  Service: Gastroenterology;  Laterality: N/A;   CYST EXCISION     throat   ESOPHAGOGASTRODUODENOSCOPY (EGD) WITH PROPOFOL N/A 09/30/2021   Procedure: ESOPHAGOGASTRODUODENOSCOPY (EGD) WITH PROPOFOL;  Surgeon: Annamaria Helling, DO;  Location[facility] ENDOSCOPY;  Service: Gastroenterology;  Laterality: N/A;   HEMORRHOID BANDING     OLECRANON BURSECTOMY Right 01/29/2018   Procedure: OLECRANON BURSECTOMY AND  ELBOW DEBRIDMENT;  Surgeon: Leim Fabry, MD;  Location: [facility] ORS;  Service: Orthopedics;  Laterality: Right;   UPPER GI ENDOSCOPY     Active Ambulatory Problems    Diagnosis Date Noted   Vasectomy evaluation 02/06/2015   Rectal bleeding 05/02/2021   Hypertension    HLD (hyperlipidemia)    GERD (gastroesophageal reflux disease)    COPD (chronic obstructive pulmonary disease) ([facility])    Anemia due to blood loss    Stroke [facility])    Alcohol abuse    Tobacco abuse    Alcoholic pancreatitis    Abnormal LFTs    Dental infection 05/02/2021   Acute pancreatitis 05/03/2021   Chronic radicular lumbar pain 05/07/2021   Right clavicle fracture 05/07/2021   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 In no apparent acute distress Vitals:   05/12/22 0856  BP: (!) 140/96  Pulse: 75   Resp: 16  Temp: (!) 97.3 F (36.3 C)  TempSrc: Temporal  SpO2: 99%  Weight: 161 lb (73 kg)  Height: _0  (1.778 m)   BMI Assessment: Estimated body mass index is 23.1 kg/m as calculated from the following:   Height as of this encounter: _1  (1.778 m).   Weight as of this encounter: 161 lb (73 kg).  BMI interpretation table: BMI level Category Range association with higher incidence of chronic pain  <18 kg/m2 Underweight   18.5-24.9 kg/m2 Ideal body weight   25-29.9 kg/m2 Overweight Increased incidence by 20%  30-34.9 kg/m2 Obese (Class I) Increased incidence by 68%  35-39.9 kg/m2 Severe obesity (Class II) Increased incidence by 136%  >40 kg/m2 Extreme obesity (Class III) Increased incidence by 254%   Patient's current BMI Ideal Body weight  Body mass index is 23.1 kg/m. Ideal body weight: 73 kg (160 lb 15 oz) Adjusted ideal body weight: 73 kg (160 lb 15.4 oz)   BMI Readings from Last 4 Encounters:  05/12/22 23.10 kg/m  02/17/22 24.25 kg/m  09/30/21 23.72 kg/m  05/02/21 20.81 kg/m   Wt Readings from Last 4 Encounters:  05/12/22 161 lb (73 kg)  02/17/22 169 lb (76.7 kg)  09/30/21 165 lb 5.5 oz (75 kg)  05/02/21 145 lb (65.8 kg)    Psych/Mental status: Alert, oriented x 3 (person, place, & time)       Eyes: PERLA Respiratory: No evidence of acute respiratory distress  Lumbar Spine Area Exam  Skin & Axial Inspection: No masses, redness, or swelling Alignment: Symmetrical Functional ROM: Pain restricted ROM affecting both sides Stability: No instability detected Muscle Tone/Strength: Functionally intact. No obvious neuro-muscular anomalies detected. Sensory (Neurological): Dermatomal pain pattern and facet mediated (multifactorial) Palpation: Complains of area being tender to palpation       Provocative Tests: Hyperextension/rotation test: (+) bilaterally for facet joint pain. Lumbar quadrant test (Kemp's test): (+) on the right for foraminal  stenosis Lateral bending test: (+) ipsilateral radicular pain, on the right. Positive for right-sided foraminal stenosis. Patrick's Maneuver: deferred today  FABER* test: deferred today                   S-I anterior distraction/compression test: deferred today         S-I lateral compression test: deferred today         S-I Thigh-thrust test: deferred today         S-I Gaenslen's test: deferred today         *(Flexion, ABduction and External Rotation) Gait & Posture Assessment  Ambulation: Unassisted Gait: Relatively normal for age and body habitus Posture: WNL  Lower Extremity Exam    Side: Right lower extremity  Side: Left lower extremity  Stability: No instability observed          Stability: No instability observed          Skin & Extremity Inspection: Skin color, temperature, and hair growth are WNL. No peripheral edema or cyanosis. No masses, redness, swelling, asymmetry, or associated skin lesions. No contractures.  Skin & Extremity Inspection: Skin color, temperature, and hair growth are WNL. No peripheral edema or cyanosis. No masses, redness, swelling, asymmetry, or associated skin lesions. No contractures.  Functional ROM: Unrestricted ROM                  Functional ROM: Unrestricted ROM                  Muscle Tone/Strength: Functionally intact. No obvious neuro-muscular anomalies detected.  Muscle Tone/Strength: Functionally intact. No obvious neuro-muscular anomalies detected.  Sensory (Neurological): Dermatomal pain pattern        Sensory (Neurological): Unimpaired        DTR: Patellar: deferred today Achilles: deferred today Plantar: deferred today  DTR: Patellar: deferred today Achilles: deferred today Plantar: deferred today  Palpation: No palpable anomalies  Palpation: No palpable anomalies    Assessment  Primary Diagnosis & Pertinent Problem List: The primary encounter diagnosis was 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). 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  Visit Diagnosis (New problems to examiner): 1. 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 Lumbar facet arthropathy   4. Lumbar degenerative disc disease   5. Lumbar spondylosis    Plan of Care (Initial workup plan)   1. 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) -Radiating right leg pain, reviewed MRI which does show foraminal stenosis on the right.  Discussed lumbar epidural steroid injection.  Risk and benefits reviewed and patient would like to proceed. - Ambulatory referral to Physical Therapy - Lumbar Epidural Injection; Future  2. 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 Lumbar facet arthropathy -Discussed diagnostic lumbar facet medial branch nerve blocks.  Will consider in the future after lumbar ESI  4. Lumbar degenerative disc disease -Physical therapy, possible diagnostic lumbar facet medial branch nerve blocks  5. Lumbar spondylosis -Physical therapy, possible diagnostic lumbar facet medial branch nerve blocks  Tobacco abuse (1 ppd) Smoking cessation instruction/counseling given:  counseled patient on the dangers of tobacco use, advised patient to stop smoking, and reviewed strategies to maximize success   Referral Orders         Ambulatory referral to Physical Therapy     Procedure Orders         Lumbar Epidural Injection  Orders Placed This Encounter  Procedures   Lumbar Epidural Injection    Standing Status:   Future    Standing Expiration Date:   08/12/2022    Scheduling Instructions:     Procedure: Interlaminar Lumbar Epidural Steroid injection (LESI)            Laterality: Right L5     Sedation:PO Valium     Timeframe: ASAA    Order  Specific Question:   Where will this procedure be performed?    Answer:   [facility]   Ambulatory referral to Physical Therapy    Referral Priority:   Routine    Referral Type:   Physical Medicine    Referral Reason:   Specialty Services Required    Requested Specialty:   Physical Therapy    Number of Visits Requested:   1      Interventional management options: Mr. Charters was informed that there is no guarantee that he would be a candidate for interventional therapies. The decision will be based on the results of diagnostic studies, as well as Mr. Delafuente's risk profile.  Procedure(s) under consideration:  Lumbar epidural steroid injection Diagnostic lumbar facet medial branch nerve blocks.    Provider-requested follow-up: Return in about 2 weeks (around 05/26/2022) for Right L4/5 ESI , in clinic (PO Valium). I spent a total of 60 minutes reviewing chart data, face-to-face evaluation with the patient, counseling and coordination of care as detailed above.   Future Appointments  Date Time Provider [place]  06/01/2022  9:20 AM [name], MD [facility] None    Note by: [name], MD Date: 05/12/2022; Time: 9:49 AM

## 2022-05-12 NOTE — Progress Notes (Signed)
Safety precautions to be maintained throughout the outpatient stay will include: orient to surroundings, keep bed in low position, maintain call bell within reach at all times, provide assistance with transfer out of bed and ambulation.  

## 2022-05-12 NOTE — Progress Notes (Signed)
Pre procedure instructions given

## 2022-05-12 NOTE — Patient Instructions (Signed)
____________________________________________________________________________________________  Post-Procedure Discharge Instructions  Instructions: Apply ice:  Purpose: This will minimize any swelling and discomfort after procedure.  When: Day of procedure, as soon as you get home. How: Fill a plastic sandwich bag with crushed ice. Cover it with a small towel and apply to injection site. How long: (15 min on, 15 min off) Apply for 15 minutes then remove x 15 minutes.  Repeat sequence on day of procedure, until you go to bed. Apply heat:  Purpose: To treat any soreness and discomfort from the procedure. When: Starting the next day after the procedure. How: Apply heat to procedure site starting the day following the procedure. How long: May continue to repeat daily, until discomfort goes away. Food intake: Start with clear liquids (like water) and advance to regular food, as tolerated.  Physical activities: Keep activities to a minimum for the first 8 hours after the procedure. After that, then as tolerated. Driving: If you have received any sedation, be responsible and do not drive. You are not allowed to drive for 24 hours after having sedation. Blood thinner: (Applies only to those taking blood thinners) You may restart your blood thinner 6 hours after your procedure. Insulin: (Applies only to Diabetic patients taking insulin) As soon as you can eat, you may resume your normal dosing schedule. Infection prevention: Keep procedure site clean and dry. Shower daily and clean area with soap and water. Post-procedure Pain Diary: Extremely important that this be done correctly and accurately. Recorded information will be used to determine the next step in treatment. For the purpose of accuracy, follow these rules: Evaluate only the area treated. Do not report or include pain from an untreated area. For the purpose of this evaluation, ignore all other areas of pain, except for the treated  area. After your procedure, avoid taking a long nap and attempting to complete the pain diary after you wake up. Instead, set your alarm clock to go off every hour, on the hour, for the initial 8 hours after the procedure. Document the duration of the numbing medicine, and the relief you are getting from it. Do not go to sleep and attempt to complete it later. It will not be accurate. If you received sedation, it is likely that you were given a medication that may cause amnesia. Because of this, completing the diary at a later time may cause the information to be inaccurate. This information is needed to plan your care. Follow-up appointment: Keep your post-procedure follow-up evaluation appointment after the procedure (usually 2 weeks for most procedures, 6 weeks for radiofrequencies). DO NOT FORGET to bring you pain diary with you.   Expect: (What should I expect to see with my procedure?) From numbing medicine (AKA: Local Anesthetics): Numbness or decrease in pain. You may also experience some weakness, which if present, could last for the duration of the local anesthetic. Onset: Full effect within 15 minutes of injected. Duration: It will depend on the type of local anesthetic used. On the average, 1 to 8 hours.  From steroids (Applies only if steroids were used): Decrease in swelling or inflammation. Once inflammation is improved, relief of the pain will follow. Onset of benefits: Depends on the amount of swelling present. The more swelling, the longer it will take for the benefits to be seen. In some cases, up to 10 days. Duration: Steroids will stay in the system x 2 weeks. Duration of benefits will depend on multiple posibilities including persistent irritating factors. Side-effects: If present, they   may typically last 2 weeks (the duration of the steroids). Frequent: Cramps (if they occur, drink Gatorade and take over-the-counter Magnesium 450-500 mg once to twice a day); water retention with  temporary weight gain; increases in blood sugar; decreased immune system response; increased appetite. Occasional: Facial flushing (red, warm cheeks); mood swings; menstrual changes. Uncommon: Long-term decrease or suppression of natural hormones; bone thinning. (These are more common with higher doses or more frequent use. This is why we prefer that our patients avoid having any injection therapies in other practices.)  Very Rare: Severe mood changes; psychosis; aseptic necrosis. From procedure: Some discomfort is to be expected once the numbing medicine wears off. This should be minimal if ice and heat are applied as instructed.  Call if: (When should I call?) You experience numbness and weakness that gets worse with time, as opposed to wearing off. New onset bowel or bladder incontinence. (Applies only to procedures done in the spine)  Emergency Numbers: Durning business hours (Monday - Thursday, 8:00 AM - 4:00 PM) (Friday, 9:00 AM - 12:00 Noon): [phone] After hours: [phone] NOTE: If you are having a problem and are unable connect with, or to talk to a provider, then go to your nearest urgent care or emergency department. If the problem is serious and urgent, please call 911. ____________________________________________________________________________________________   Epidural Steroid Injection Patient Information  Description: The epidural space surrounds the nerves as they exit the spinal cord.  In some patients, the nerves can be compressed and inflamed by a bulging disc or a tight spinal canal (spinal stenosis).  By injecting steroids into the epidural space, we can bring irritated nerves into direct contact with a potentially helpful medication.  These steroids act directly on the irritated nerves and can reduce swelling and inflammation which often leads to decreased pain.  Epidural steroids may be injected anywhere along the spine and from the neck to the low back  depending upon the location of your pain.   After numbing the skin with local anesthetic (like Novocaine), a small needle is passed into the epidural space slowly.  You may experience a sensation of pressure while this is being done.  The entire block usually last less than 10 minutes.  Conditions which may be treated by epidural steroids:  Low back and leg pain Neck and arm pain Spinal stenosis Post-laminectomy syndrome Herpes zoster (shingles) pain Pain from compression fractures  Preparation for the injection:  Do not eat any solid food or dairy products within 8 hours of your appointment.  You may drink clear liquids up to 3 hours before appointment.  Clear liquids include water, black coffee, juice or soda.  No milk or cream please. You may take your regular medication, including pain medications, with a sip of water before your appointment  Diabetics should hold regular insulin (if taken separately) and take 1/2 normal NPH dos the morning of the procedure.  Carry some sugar containing items with you to your appointment. A driver must accompany you and be prepared to drive you home after your procedure.  Bring all your current medications with your. An IV may be inserted and sedation may be given at the discretion of the physician.   A blood pressure cuff, EKG and other monitors will often be applied during the procedure.  Some patients may need to have extra oxygen administered for a short period. You will be asked to provide medical information, including your allergies, prior to the procedure.  We must know   immediately if you are taking blood thinners (like Coumadin/Warfarin)  Or if you are allergic to IV iodine contrast (dye). We must know if you could possible be pregnant.  Possible side-effects: Bleeding from needle site Infection (rare, may require surgery) Nerve injury (rare) Numbness & tingling (temporary) Difficulty urinating (rare, temporary) Spinal headache ( a headache  worse with upright posture) Light -headedness (temporary) Pain at injection site (several days) Decreased blood pressure (temporary) Weakness in arm/leg (temporary) Pressure sensation in back/neck (temporary)  Call if you experience: Fever/chills associated with headache or increased back/neck pain. Headache worsened by an upright position. New onset weakness or numbness of an extremity below the injection site Hives or difficulty breathing (go to the emergency room) Inflammation or drainage at the infection site Severe back/neck pain Any new symptoms which are concerning to you  Please note:  Although the local anesthetic injected can often make your back or neck feel good for several hours after the injection, the pain will likely return.  It takes 3-7 days for steroids to work in the epidural space.  You may not notice any pain relief for at least that one week.  If effective, we will often do a series of three injections spaced 3-6 weeks apart to maximally decrease your pain.  After the initial series, we generally will wait several months before considering a repeat injection of the same type.  If you have any questions, please call [phone]  [facility] 

## 2022-06-01 ENCOUNTER — Ambulatory Visit
Payer: Medicare Other | Attending: Student in an Organized Health Care Education/Training Program | Admitting: Student in an Organized Health Care Education/Training Program

## 2022-06-01 ENCOUNTER — Encounter: Payer: Self-pay | Admitting: Student in an Organized Health Care Education/Training Program

## 2022-06-01 ENCOUNTER — Ambulatory Visit
Admission: RE | Admit: 2022-06-01 | Discharge: 2022-06-01 | Disposition: A | Discharge status: Home / Self Care | Payer: Medicare Other | Source: Ambulatory Visit | Attending: Student in an Organized Health Care Education/Training Program | Admitting: Student in an Organized Health Care Education/Training Program

## 2022-06-01 VITALS — BP 134/95 | HR 87 | Temp 97.7°F | Resp 12 | Ht 70.0 in | Wt 156.0 lb

## 2022-06-01 DIAGNOSIS — M48061 Spinal stenosis, lumbar region without neurogenic claudication: Secondary | ICD-10-CM | POA: Insufficient documentation

## 2022-06-01 DIAGNOSIS — M5416 Radiculopathy, lumbar region: Secondary | ICD-10-CM | POA: Diagnosis present

## 2022-06-01 DIAGNOSIS — G8929 Other chronic pain: Secondary | ICD-10-CM | POA: Insufficient documentation

## 2022-06-01 MED ORDER — DIAZEPAM 5 MG PO TABS
ORAL_TABLET | ORAL | Status: AC
  Filled 2022-06-01: qty 1

## 2022-06-01 MED ORDER — SODIUM CHLORIDE (PF) 0.9 % IJ SOLN
INTRAMUSCULAR | Status: AC
  Filled 2022-06-01: qty 10

## 2022-06-01 MED ORDER — DEXAMETHASONE SODIUM PHOSPHATE 10 MG/ML IJ SOLN
10.0000 mg | Freq: Once | INTRAMUSCULAR | Status: AC
  Administered 2022-06-01: 10 mg
  Filled 2022-06-01: qty 1

## 2022-06-01 MED ORDER — LIDOCAINE HCL 2 % IJ SOLN
20.0000 mL | Freq: Once | INTRAMUSCULAR | Status: AC
  Administered 2022-06-01: 400 mg
  Filled 2022-06-01: qty 40

## 2022-06-01 MED ORDER — SODIUM CHLORIDE 0.9% FLUSH
2.0000 mL | Freq: Once | INTRAVENOUS | Status: AC
  Administered 2022-06-01: 2 mL

## 2022-06-01 MED ORDER — ROPIVACAINE HCL 2 MG/ML IJ SOLN
2.0000 mL | Freq: Once | INTRAMUSCULAR | Status: AC
  Administered 2022-06-01: 2 mL via EPIDURAL
  Filled 2022-06-01: qty 20

## 2022-06-01 MED ORDER — IOHEXOL 180 MG/ML  SOLN
10.0000 mL | Freq: Once | INTRAMUSCULAR | Status: AC
  Administered 2022-06-01: 10 mL via EPIDURAL
  Filled 2022-06-01: qty 20

## 2022-06-01 MED ORDER — DIAZEPAM 5 MG PO TABS
5.0000 mg | ORAL_TABLET | ORAL | Status: AC
  Administered 2022-06-01: 5 mg via ORAL

## 2022-06-01 NOTE — Patient Instructions (Signed)

## 2022-06-01 NOTE — Progress Notes (Signed)
PROVIDER NOTE: Interpretation of information contained herein should be left to medically-trained personnel. Specific patient instructions are provided elsewhere under "Patient Instructions" section of medical record. This document was created in part using STT-dictation technology, any transcriptional errors that may result from this process are unintentional.  Patient: Ronald Watts Type: Established DOB: March 07, 1966 MRN: [id number] PCP: [name], MD  Service: Procedure DOS: 06/01/2022 Setting: Ambulatory Location: Ambulatory outpatient facility Delivery: Face-to-face Provider: [name], MD Specialty: Interventional Pain Management Specialty designation: 09 Location: Outpatient facility Ref. Prov.: [name], MD    Primary Reason for Visit: Interventional Pain Management Treatment. CC: Back Pain (lower)   Procedure:           Type: Lumbar epidural steroid injection (LESI) (interlaminar) #1    Laterality: Right   Level:  L5-S1 Level.  Imaging: Fluoroscopic guidance         Anesthesia: Local anesthesia (1-2% Lidocaine) Anxiolysis: DOS: 06/01/2022  Performed by: [name], MD  Purpose: Diagnostic/Therapeutic Indications: Lumbar radicular pain of intraspinal etiology of more than 4 weeks that has failed to respond to conservative therapy and is severe enough to impact quality of life or function. 1. 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  Prep solution: DuraPrep (Iodine Povacrylex [0.7% available iodine] and Isopropyl Alcohol, 74% w/w) Prep Area: Entire Posterior Lumbar Region from lower scapular tip down to mid buttocks area and from flank to flank. Materials:  Tray: Epidural tray Needle(s):  Type:  Epidural needle (Tuohy) Gauge (G):  17 Length: Regular (3.5-in) Qty: 1  Pre-op H&P Assessment:  Ronald Watts is a 56 y.o. (year old), male patient, seen today for interventional treatment. He  has a past surgical history that includes Colonoscopy; Upper gi endoscopy; Cyst excision; Olecranon bursectomy (Right, 01/29/2018); Hemorrhoid banding; Colonoscopy with propofol (N/A, 09/30/2021); and Esophagogastroduodenoscopy (egd) with propofol (N/A, 09/30/2021). Ronald Watts has a current medication list which includes the following prescription(s): cyclobenzaprine, eszopiclone, ferrous sulfate, gabapentin, gabapentin, ibuprofen, magnesium oxide, omeprazole, sildenafil, tramadol, atorvastatin, hydrochlorothiazide, lisinopril, and ondansetron. His primarily concern today is the Back Pain (lower)  Initial Vital Signs:  Pulse/HCG Rate: 87ECG Heart Rate: 70 Temp: 97.7 F (36.5 C) Resp: 18 BP: (!) 135/112 SpO2: 98 %  BMI: Estimated body mass index is 22.38 kg/m as calculated from the following:   Height as of this encounter: 5\' 10"  (1.778 m).   Weight as of this encounter: 156 lb (70.8 kg).  Risk Assessment: Allergies: Reviewed. He has No Known Allergies.  Allergy Precautions: None required Coagulopathies: Reviewed. None identified.  Blood-thinner therapy: None at this time Active Infection(s): Reviewed. None identified. Ronald Watts is afebrile  Site Confirmation: Ronald Watts was asked to confirm the procedure and laterality before marking the site Procedure checklist: Completed Consent: Before the procedure and under the influence of no sedative(s), amnesic(s), or anxiolytics, the patient was informed of the treatment options, risks and possible complications. To fulfill our ethical and legal obligations, as recommended by the American Medical Association's Code of Ethics, I have informed the patient of my clinical impression; the nature and purpose of the treatment or procedure; the risks, benefits, and possible  complications of the intervention; the alternatives, including doing nothing; the risk(s) and  benefit(s) of the alternative treatment(s) or procedure(s); and the risk(s) and benefit(s) of doing nothing. The patient was provided information about the general risks and possible complications associated with the procedure. These may include, but are not limited to: failure to achieve desired goals, infection, bleeding, organ or nerve damage, allergic reactions, paralysis, and death. In addition, the patient was informed of those risks and complications associated to Spine-related procedures, such as failure to decrease pain; infection (i.e.: Meningitis, epidural or intraspinal abscess); bleeding (i.e.: epidural hematoma, subarachnoid hemorrhage, or any other type of intraspinal or peri-dural bleeding); organ or nerve damage (i.e.: Any type of peripheral nerve, nerve root, or spinal cord injury) with subsequent damage to sensory, motor, and/or autonomic systems, resulting in permanent pain, numbness, and/or weakness of one or several areas of the body; allergic reactions; (i.e.: anaphylactic reaction); and/or death. Furthermore, the patient was informed of those risks and complications associated with the medications. These include, but are not limited to: allergic reactions (i.e.: anaphylactic or anaphylactoid reaction(s)); adrenal axis suppression; blood sugar elevation that in diabetics may result in ketoacidosis or comma; water retention that in patients with history of congestive heart failure may result in shortness of breath, pulmonary edema, and decompensation with resultant heart failure; weight gain; swelling or edema; medication-induced neural toxicity; particulate matter embolism and blood vessel occlusion with resultant organ, and/or nervous system infarction; and/or aseptic necrosis of one or more joints. Finally, the patient was informed that Medicine is not an exact science; therefore, there is also  the possibility of unforeseen or unpredictable risks and/or possible complications that may result in a catastrophic outcome. The patient indicated having understood very clearly. We have given the patient no guarantees and we have made no promises. Enough time was given to the patient to ask questions, all of which were answered to the patient's satisfaction. Mr. Villapando has indicated that he wanted to continue with the procedure. Attestation: I, the ordering provider, attest that I have discussed with the patient the benefits, risks, side-effects, alternatives, likelihood of achieving goals, and potential problems during recovery for the procedure that I have provided informed consent. Date  Time: 06/01/2022  9:19 AM  Pre-Procedure Preparation:  Monitoring: As per clinic protocol. Respiration, ETCO2, SpO2, BP, heart rate and rhythm monitor placed and checked for adequate function Safety Precautions: Patient was assessed for positional comfort and pressure points before starting the procedure. Time-out: I initiated and conducted the "Time-out" before starting the procedure, as per protocol. The patient was asked to participate by confirming the accuracy of the "Time Out" information. Verification of the correct person, site, and procedure were performed and confirmed by me, the nursing staff, and the patient. "Time-out" conducted as per Joint Commission's Universal Protocol (UP.01.01.01). Time: 0955  Description/Narrative of Procedure:          Target: Epidural space via interlaminar opening, initially targeting the lower laminar border of the superior vertebral body. Region: Lumbar Approach: Percutaneous paravertebral  Rationale (medical necessity): procedure needed and proper for the diagnosis and/or treatment of the patient's medical symptoms and needs. Procedural Technique Safety Precautions: Aspiration looking for blood return was conducted prior to all injections. At no point did we inject any  substances, as a needle was being advanced. No attempts were made at seeking any paresthesias. Safe injection practices and needle disposal techniques used. Medications properly checked for expiration dates. SDV (single dose vial) medications used. Description of the Procedure: Protocol guidelines were followed. The procedure needle was introduced through the skin, ipsilateral  to the reported pain, and advanced to the target area. Bone was contacted and the needle walked caudad, until the lamina was cleared. The epidural space was identified using "loss-of-resistance technique" with 2-3 ml of PF-NaCl (0.9% NSS), in a 5cc LOR glass syringe.  Vitals:   06/01/22 0947 06/01/22 0952 06/01/22 0957 06/01/22 1000  BP: (!) 135/92 (!) 129/94 (!) 123/90 (!) 134/95  Pulse:      Resp: 16 18 18 12   Temp:      TempSrc:      SpO2: 96% 94% 94% 95%  Weight:      Height:        Start Time: 0955 hrs. End Time: 0958 hrs.  Imaging Guidance (Spinal):          Type of Imaging Technique: Fluoroscopy Guidance (Spinal) Indication(s): Assistance in needle guidance and placement for procedures requiring needle placement in or near specific anatomical locations not easily accessible without such assistance. Exposure Time: Please see nurses notes. Contrast: Before injecting any contrast, we confirmed that the patient did not have an allergy to iodine, shellfish, or radiological contrast. Once satisfactory needle placement was completed at the desired level, radiological contrast was injected. Contrast injected under live fluoroscopy. No contrast complications. See chart for type and volume of contrast used. Fluoroscopic Guidance: I was personally present during the use of fluoroscopy. "Tunnel Vision Technique" used to obtain the best possible view of the target area. Parallax error corrected before commencing the procedure. "Direction-depth-direction" technique used to introduce the needle under continuous pulsed  fluoroscopy. Once target was reached, antero-posterior, oblique, and lateral fluoroscopic projection used confirm needle placement in all planes. Images permanently stored in EMR. Interpretation: I personally interpreted the imaging intraoperatively. Adequate needle placement confirmed in multiple planes. Appropriate spread of contrast into desired area was observed. No evidence of afferent or efferent intravascular uptake. No intrathecal or subarachnoid spread observed. Permanent images saved into the patient's record.  Antibiotic Prophylaxis:   Anti-infectives (From admission, onward)    None      Indication(s): None identified  Post-operative Assessment:  Post-procedure Vital Signs:  Pulse/HCG Rate: 8774 Temp: 97.7 F (36.5 C) Resp: 12 BP: (!) 134/95 SpO2: 95 %  EBL: None  Complications: No immediate post-treatment complications observed by team, or reported by patient.  Note: The patient tolerated the entire procedure well. A repeat set of vitals were taken after the procedure and the patient was kept under observation following institutional policy, for this type of procedure. Post-procedural neurological assessment was performed, showing return to baseline, prior to discharge. The patient was provided with post-procedure discharge instructions, including a section on how to identify potential problems. Should any problems arise concerning this procedure, the patient was given instructions to immediately contact , at any time, without hesitation. In any case, we plan to contact the patient by telephone for a follow-up status report regarding this interventional procedure.  Comments:  No additional relevant information.  Plan of Care  Orders:  Orders Placed This Encounter  Procedures   DG PAIN CLINIC C-ARM 1-60 MIN NO REPORT    Intraoperative interpretation by procedural physician at [facility].    Standing Status:   Standing    Number of Occurrences:   1     Order Specific Question:   Reason for exam:    Answer:   Assistance in needle guidance and placement for procedures requiring needle placement in or near specific anatomical locations not easily accessible without such assistance.  Medications ordered for procedure: Meds ordered this encounter  Medications   iohexol (OMNIPAQUE) 180 MG/ML injection 10 mL    Must be Myelogram-compatible. If not available, you may substitute with a water-soluble, non-ionic, hypoallergenic, myelogram-compatible radiological contrast medium.   lidocaine (XYLOCAINE) 2 % (with pres) injection 400 mg   diazepam (VALIUM) tablet 5 mg    Make sure Flumazenil is available in the pyxis when using this medication. If oversedation occurs, administer 0.2 mg IV over 15 sec. If after 45 sec no response, administer 0.2 mg again over 1 min; may repeat at 1 min intervals; not to exceed 4 doses (1 mg)   ropivacaine (PF) 2 mg/mL (0.2%) (NAROPIN) injection 2 mL   sodium chloride flush (NS) 0.9 % injection 2 mL   dexamethasone (DECADRON) injection 10 mg   Medications administered: We administered iohexol, lidocaine, diazepam, ropivacaine (PF) 2 mg/mL (0.2%), sodium chloride flush, and dexamethasone.  See the medical record for exact dosing, route, and time of administration.  Follow-up plan:   Return in about 5 weeks (around 07/07/2022) for Post Procedure Evaluation, virtual.       Right L5/S1 06/01/22   Recent Visits Date Type Provider Dept  05/12/22 Office Visit [name], MD [facility]  Showing recent visits within past 90 days and meeting all other requirements Today's Visits Date Type Provider Dept  06/01/22 Procedure visit [name], MD [facility]  Showing today's visits and meeting all other requirements Future Appointments Date Type Provider Dept  07/07/22 Appointment [name], MD [facility]  Showing future appointments within next 90 days and meeting all other  requirements  Disposition: Discharge home  Discharge (Date  Time): 06/01/2022; 1007 hrs.   Primary Care Physician: [name], MD Location: [facility] Note by: [name], MD Date: 06/01/2022; Time: 10:19 AM  Disclaimer:  Medicine is not an exact science. The only guarantee in medicine is that nothing is guaranteed. It is important to note that the decision to proceed with this intervention was based on the information collected from the patient. The Data and conclusions were drawn from the patient's questionnaire, the interview, and the physical examination. Because the information was provided in large part by the patient, it cannot be guaranteed that it has not been purposely or unconsciously manipulated. Every effort has been made to obtain as much relevant data as possible for this evaluation. It is important to note that the conclusions that lead to this procedure are derived in large part from the available data. Always take into account that the treatment will also be dependent on availability of resources and existing treatment guidelines, considered by other Pain Management Practitioners as being common knowledge and practice, at the time of the intervention. For Medico-Legal purposes, it is also important to point out that variation in procedural techniques and pharmacological choices are the acceptable norm. The indications, contraindications, technique, and results of the above procedure should only be interpreted and judged by a Board-Certified Interventional Pain Specialist with extensive familiarity and expertise in the same exact procedure and technique.

## 2022-06-01 NOTE — Progress Notes (Signed)
Safety precautions to be maintained throughout the outpatient stay will include: orient to surroundings, keep bed in low position, maintain call bell within reach at all times, provide assistance with transfer out of bed and ambulation.  

## 2022-07-07 ENCOUNTER — Ambulatory Visit
Payer: Medicare Other | Attending: Student in an Organized Health Care Education/Training Program | Admitting: Student in an Organized Health Care Education/Training Program

## 2022-07-07 ENCOUNTER — Encounter: Payer: Self-pay | Admitting: Student in an Organized Health Care Education/Training Program

## 2022-07-07 DIAGNOSIS — G8929 Other chronic pain: Secondary | ICD-10-CM

## 2022-07-07 DIAGNOSIS — M48061 Spinal stenosis, lumbar region without neurogenic claudication: Secondary | ICD-10-CM | POA: Diagnosis not present

## 2022-07-07 DIAGNOSIS — M5416 Radiculopathy, lumbar region: Secondary | ICD-10-CM

## 2022-07-07 NOTE — Progress Notes (Signed)
Patient: Ronald Watts  Service Category: E/M  Provider: [name], MD  DOB: February 07, 1966  DOS: 07/07/2022  Location: Office  MRN: [id number]  Setting: Ambulatory outpatient  Referring Provider: [name], MD  Type: Established Patient  Specialty: Interventional Pain Management  PCP: [name], MD  Location: Remote location  Delivery: TeleHealth     Virtual Encounter - Pain Management PROVIDER NOTE: Information contained herein reflects review and annotations entered in association with encounter. Interpretation of such information and data should be left to medically-trained personnel. Information provided to patient can be located elsewhere in the medical record under "Patient Instructions". Document created using STT-dictation technology, any transcriptional errors that may result from process are unintentional.    Contact & Pharmacy Preferred: [place]: [phone] (home) Mobile: [phone] (mobile) E-mail: [email or web address]  [id number] [place] [id number] [id number]: [phone]: [phone]  Pre-screening  Mr. HPensonoffered "in-person" vs "virtual" encounter. He indicated preferring virtual for this encounter.   Reason COVID-19*  Social distancing based on CDC and AMA recommendations.   I contacted Ronald Watts 07/07/2022 via telephone.      I clearly identified myself as [name] MD. I verified that I was speaking with the correct person using two identifiers (Name: JJOSIA Watts and date of birth: 105-Jun-1967.  Consent I sought verbal advanced consent from [name] virtual visit interactions. I informed [name] possible security and privacy concerns, risks, and limitations associated with providing "not-in-person" medical evaluation and management services. I also informed [name] the availability of "in-person" appointments. Finally, I informed him that there would be a charge  for the virtual visit and that he could be  personally, fully or partially, financially responsible for it. [name] understanding and agreed to proceed.   Historic Elements   Mr. Ronald TOTOis a 56y.o. year old, male patient evaluated today after our last contact on 06/01/2022. Ronald Watts has a past medical history of Alcohol-induced acute pancreatitis, Anemia, Barrett's syndrome, COPD (chronic obstructive pulmonary disease) ([place], ED (erectile dysfunction), GERD (gastroesophageal reflux disease), Hepatitis, History of blood transfusion, HLD (hyperlipidemia), Hypertension, Knee pain, right, Sciatica, and Stroke ([place]. He also  has a past surgical history that includes Colonoscopy; Upper gi endoscopy; Cyst excision; Olecranon bursectomy (Right, 01/29/2018); Hemorrhoid banding; Colonoscopy with propofol (N/A, 09/30/2021); and Esophagogastroduodenoscopy (egd) with propofol (N/A, 09/30/2021). [name] a current medication list which includes the following prescription(s): cyclobenzaprine, eszopiclone, ferrous sulfate, gabapentin, gabapentin, ibuprofen, magnesium oxide, omeprazole, sildenafil, tramadol, atorvastatin, hydrochlorothiazide, lisinopril, and ondansetron. He  reports that he has been smoking cigarettes. He has a 30.00 pack-year smoking history. He has never used smokeless tobacco. He reports current alcohol use of about 6.0 standard drinks of alcohol per week. He reports current drug use. Drug: Marijuana. [name] No Known Allergies.  Estimated body mass index is 22.38 kg/m as calculated from the following:   Height as of 06/01/22: _1  (1.778 m).   Weight as of 06/01/22: 156 lb (70.8 kg).  HPI  Today, he is being contacted for a post-procedure assessment.   Post-procedure evaluation   Type: Lumbar epidural steroid injection (LESI) (interlaminar) #1    Laterality: Right   Level:  L5-S1 Level.  Imaging: Fluoroscopic guidance         Anesthesia: Local anesthesia (1-2%  Lidocaine) Anxiolysis: DOS: 06/01/2022  Performed by: [name]  MD  Purpose: Diagnostic/Therapeutic Indications: Lumbar radicular pain of intraspinal etiology of more than 4 weeks that has failed to respond to conservative therapy and is severe enough to impact quality of life or function. 1. 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)    NAS-11 Pain score:   Pre-procedure: 8 /10   Post-procedure: 6 /10      Effectiveness:  Initial hour after procedure: 100 %  Subsequent 4-6 hours post-procedure: 100 %  Analgesia past initial 6 hours: 100 % (lasting 2 days then has come back worse than before.)  Ongoing improvement:  Analgesic:  <25% however patient states that he is able to do more and has more strength in his right leg Function: Somewhat improved ROM: Back to baseline   Laboratory Chemistry Profile   Renal Lab Results  Component Value Date   BUN 14 05/09/2021   CREATININE 1.12 05/09/2021   GFRNONAA >60 05/09/2021    Hepatic Lab Results  Component Value Date   AST 33 05/09/2021   ALT 38 05/09/2021   ALBUMIN 3.3 (L) 05/09/2021   ALKPHOS 83 05/09/2021   HCVAB NON REACTIVE 05/03/2021   LIPASE 65 (H) 05/09/2021   AMMONIA 18 05/03/2021    Electrolytes Lab Results  Component Value Date   NA 138 05/09/2021   K 4.2 05/09/2021   CL 104 05/09/2021   CALCIUM 9.4 05/09/2021   MG 1.9 05/09/2021   PHOS 4.3 05/09/2021    Bone No results found for: "VD25OH", "VD125OH2TOT", "DX4128NO6", "VE7209OB0", "25OHVITD1", "25OHVITD2", "25OHVITD3", "TESTOFREE", "TESTOSTERONE"  Inflammation (CRP: Acute Phase) (ESR: Chronic Phase) No results found for: "CRP", "ESRSEDRATE", "LATICACIDVEN"       Note: Above Lab results reviewed.  Narrative & Impression  CLINICAL DATA:  Low back pain, > 6 wks, r/o nerve root compression   EXAM: MRI LUMBAR SPINE WITHOUT AND WITH CONTRAST   TECHNIQUE: Multiplanar and multiecho pulse sequences  of the lumbar spine were obtained without and with intravenous contrast.   CONTRAST:  68m GADAVIST GADOBUTROL 1 MMOL/ML IV SOLN   COMPARISON:  None.   FINDINGS: Segmentation:  Standard.   Alignment:  Multilevel trace retrolisthesis.  Mild dextrocurvature.   Vertebrae: Vertebral body heights are maintained apart from degenerative endplate irregularity at L5-S1. Degenerative endplate marrow changes are present at this level including mild edema.   Conus medullaris and cauda equina: Conus extends to the T12-L1 level. Conus and cauda equina appear normal. No abnormal intrathecal enhancement.   Paraspinal and other soft tissues: Unremarkable.   Disc levels:   L1-L2: Minimal disc bulge with trace left central extrusion extending slightly above disc level. No canal or foraminal stenosis.   L2-L3: Disc bulge slightly eccentric to the right. No canal or foraminal stenosis.   L3-L4: Disc bulge with prominent foraminal components bilaterally. No canal stenosis. Slight effacement of the left subarticular recess. Mild right and mild to moderate left foraminal stenosis. Disc abuts and may compress the exiting left L3 nerve root.   L4-L5: Disc bulge with prominent foraminal components bilaterally. Facet arthropathy. No canal stenosis. Slight effacement of subarticular recesses. Moderate foraminal stenosis.   L5-S1: Disc bulge with endplate osteophytic ridging. Facet arthropathy. No canal stenosis. Moderate to marked foraminal stenosis. Disc/osteophyte abuts and may compress the exiting right greater than left L5 nerve roots.   IMPRESSION: Multilevel degenerative changes as detailed above. No high-grade canal stenosis. Foraminal narrowing is greatest at L5-S1. Potential exiting nerve root compression on the left at  L3-L4 and bilaterally at L5-S1.    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 A diagnosis of Neuroforaminal stenosis of  lumbar spine (left L5) was also pertinent to this visit.  Plan of Care  Short-term response to right L5-S1 interlaminar epidural steroid injection.  Reviewed lumbar MRI with patient again and offered him a right transforaminal L5 epidural injection.  He states that he will think about this further with his wife and call us to schedule if he would like to proceed.  Orders:  Orders Placed This Encounter  Procedures   Lumbar Transforaminal Epidural    Standing Status:   Standing    Number of Occurrences:   1    Standing Expiration Date:   01/06/2023    Scheduling Instructions:     Right L5 TF ESI PRN- pt will call if he wants to schedule    Order Specific Question:   Where will this procedure be performed?    Answer:   [facility]   Follow-up plan:   Return for Right L5 TF ESI PRN- pt will call if he wants to schedule (PRN).     Right L5/S1 06/01/22    Recent Visits Date Type Provider Dept  06/01/22 Procedure visit [name], MD [facility]  05/12/22 Office Visit [name], MD [facility]  Showing recent visits within past 90 days and meeting all other requirements Today's Visits Date Type Provider Dept  07/07/22 Office Visit [name], MD [facility]  Showing today's visits and meeting all other requirements Future Appointments No visits were found meeting these conditions. Showing future appointments within next 90 days and meeting all other requirements  I discussed the assessment and treatment plan with the patient. The patient was provided an opportunity to ask questions and all were answered. The patient agreed with the plan and demonstrated an understanding of the instructions.  Patient advised to call back or seek an in-person evaluation if the symptoms or condition worsens.  Duration of encounter: 61mnutes.  Note by: [name] MD Date: 07/07/2022; Time: 3:17 PM

## 2022-08-29 ENCOUNTER — Encounter: Payer: Self-pay | Admitting: Student in an Organized Health Care Education/Training Program

## 2022-08-29 ENCOUNTER — Telehealth: Payer: Medicare Other | Admitting: Student in an Organized Health Care Education/Training Program

## 2022-08-29 ENCOUNTER — Ambulatory Visit
Admission: RE | Admit: 2022-08-29 | Discharge: 2022-08-29 | Disposition: A | Discharge status: Home / Self Care | Payer: Medicare Other | Source: Ambulatory Visit | Attending: Student in an Organized Health Care Education/Training Program | Admitting: Student in an Organized Health Care Education/Training Program

## 2022-08-29 ENCOUNTER — Ambulatory Visit
Payer: Medicare Other | Attending: Student in an Organized Health Care Education/Training Program | Admitting: Student in an Organized Health Care Education/Training Program

## 2022-08-29 VITALS — BP 169/109 | HR 66 | Temp 97.0°F | Resp 14 | Ht 70.0 in | Wt 150.0 lb

## 2022-08-29 DIAGNOSIS — M5416 Radiculopathy, lumbar region: Secondary | ICD-10-CM | POA: Insufficient documentation

## 2022-08-29 DIAGNOSIS — G8929 Other chronic pain: Secondary | ICD-10-CM | POA: Insufficient documentation

## 2022-08-29 DIAGNOSIS — M48061 Spinal stenosis, lumbar region without neurogenic claudication: Secondary | ICD-10-CM | POA: Insufficient documentation

## 2022-08-29 MED ORDER — ROPIVACAINE HCL 2 MG/ML IJ SOLN
1.0000 mL | Freq: Once | INTRAMUSCULAR | Status: AC
  Administered 2022-08-29: 1 mL via EPIDURAL
  Filled 2022-08-29: qty 20

## 2022-08-29 MED ORDER — IOPAMIDOL (ISOVUE-M 200) INJECTION 41%: 10.0000 mL | Freq: Once | INTRAMUSCULAR | Status: DC

## 2022-08-29 MED ORDER — SODIUM CHLORIDE (PF) 0.9 % IJ SOLN
INTRAMUSCULAR | Status: AC
  Filled 2022-08-29: qty 10

## 2022-08-29 MED ORDER — IOHEXOL 180 MG/ML  SOLN
INTRAMUSCULAR | Status: AC
Start: 2022-08-29 — End: ?
  Filled 2022-08-29: qty 20

## 2022-08-29 MED ORDER — DEXAMETHASONE SODIUM PHOSPHATE 10 MG/ML IJ SOLN
10.0000 mg | Freq: Once | INTRAMUSCULAR | Status: AC
  Administered 2022-08-29: 10 mg
  Filled 2022-08-29: qty 1

## 2022-08-29 MED ORDER — LIDOCAINE HCL 2 % IJ SOLN
20.0000 mL | Freq: Once | INTRAMUSCULAR | Status: AC
  Administered 2022-08-29: 200 mg
  Filled 2022-08-29: qty 20

## 2022-08-29 MED ORDER — SODIUM CHLORIDE 0.9% FLUSH
1.0000 mL | Freq: Once | INTRAVENOUS | Status: AC
  Administered 2022-08-29: 1 mL

## 2022-08-29 NOTE — Progress Notes (Deleted)
PROVIDER NOTE: Interpretation of information contained herein should be left to medically-trained personnel. Specific patient instructions are provided elsewhere under "Patient Instructions" section of medical record. This document was created in part using STT-dictation technology, any transcriptional errors that may result from this process are unintentional.  Patient: Ronald Watts Type: Established DOB: 17-Sep-1965 MRN: [id number] PCP: [name], MD  Service: Procedure DOS: 08/29/2022 Setting: Ambulatory Location: Ambulatory outpatient facility Delivery: Face-to-face Provider: [name], MD Specialty: Interventional Pain Management Specialty designation: 09 Location: Outpatient facility Ref. Prov.: [name], MD    Primary Reason for Visit: Interventional Pain Management Treatment. CC: Back Pain   Procedure:           Type: Lumbar epidural steroid injection (LESI) (interlaminar) #2    Laterality: Right   Level:  L5-S1 Level.  Imaging: Fluoroscopic guidance         Anesthesia: Local anesthesia (1-2% Lidocaine) Anxiolysis: DOS: 08/29/2022  Performed by: [name], MD  Purpose: Diagnostic/Therapeutic Indications: Lumbar radicular pain of intraspinal etiology of more than 4 weeks that has failed to respond to conservative therapy and is severe enough to impact quality of life or function. No diagnosis found.  NAS-11 Pain score:   Pre-procedure: 9 /10   Post-procedure: 9 /10      Position / Prep / Materials:  Position: Prone w/ head of the table raised (slight reverse trendelenburg) to facilitate breathing.  Prep solution: DuraPrep (Iodine Povacrylex [0.7% available iodine] and Isopropyl Alcohol, 74% w/w) Prep Area: Entire Posterior Lumbar Region from lower scapular tip down to mid buttocks area and from flank to flank. Materials:  Tray: Epidural tray Needle(s):  Type: Epidural needle (Tuohy) Gauge (G):  17 Length: Regular (3.5-in) Qty: 1  Pre-op H&P  Assessment:  Ronald Watts is a 57 y.o. (year old), male patient, seen today for interventional treatment. He  has a past surgical history that includes Colonoscopy; Upper gi endoscopy; Cyst excision; Olecranon bursectomy (Right, 01/29/2018); Hemorrhoid banding; Colonoscopy with propofol (N/A, 09/30/2021); and Esophagogastroduodenoscopy (egd) with propofol (N/A, 09/30/2021). Ronald Watts has a current medication list which includes the following prescription(s): cyclobenzaprine, eszopiclone, ferrous sulfate, gabapentin, gabapentin, ibuprofen, lisinopril, magnesium oxide, omeprazole, sildenafil, tramadol, atorvastatin, hydrochlorothiazide, and ondansetron. His primarily concern today is the Back Pain  Initial Vital Signs:  Pulse/HCG Rate: 66  Temp: (!) 97 F (36.1 C) Resp: 18 BP: (!) 138/93 SpO2: 96 %  BMI: Estimated body mass index is 21.52 kg/m as calculated from the following:   Height as of this encounter: 5' 10"$  (1.778 m).   Weight as of this encounter: 150 lb (68 kg).  Risk Assessment: Allergies: Reviewed. He has No Known Allergies.  Allergy Precautions: None required Coagulopathies: Reviewed. None identified.  Blood-thinner therapy: None at this time Active Infection(s): Reviewed. None identified. Ronald Watts is afebrile  Site Confirmation: Ronald Watts was asked to confirm the procedure and laterality before marking the site Procedure checklist: Completed Consent: Before the procedure and under the influence of no sedative(s), amnesic(s), or anxiolytics, the patient was informed of the treatment options, risks and possible complications. To fulfill our ethical and legal obligations, as recommended by the American Medical Association's Code of Ethics, I have informed the patient of my clinical impression; the nature and purpose of the treatment or procedure; the risks, benefits, and possible complications of the intervention; the alternatives, including doing nothing; the risk(s) and benefit(s) of the  alternative treatment(s) or procedure(s); and the risk(s) and benefit(s) of doing nothing. The patient was provided  information about the general risks and possible complications associated with the procedure. These may include, but are not limited to: failure to achieve desired goals, infection, bleeding, organ or nerve damage, allergic reactions, paralysis, and death. In addition, the patient was informed of those risks and complications associated to Spine-related procedures, such as failure to decrease pain; infection (i.e.: Meningitis, epidural or intraspinal abscess); bleeding (i.e.: epidural hematoma, subarachnoid hemorrhage, or any other type of intraspinal or peri-dural bleeding); organ or nerve damage (i.e.: Any type of peripheral nerve, nerve root, or spinal cord injury) with subsequent damage to sensory, motor, and/or autonomic systems, resulting in permanent pain, numbness, and/or weakness of one or several areas of the body; allergic reactions; (i.e.: anaphylactic reaction); and/or death. Furthermore, the patient was informed of those risks and complications associated with the medications. These include, but are not limited to: allergic reactions (i.e.: anaphylactic or anaphylactoid reaction(s)); adrenal axis suppression; blood sugar elevation that in diabetics may result in ketoacidosis or comma; water retention that in patients with history of congestive heart failure may result in shortness of breath, pulmonary edema, and decompensation with resultant heart failure; weight gain; swelling or edema; medication-induced neural toxicity; particulate matter embolism and blood vessel occlusion with resultant organ, and/or nervous system infarction; and/or aseptic necrosis of one or more joints. Finally, the patient was informed that Medicine is not an exact science; therefore, there is also the possibility of unforeseen or unpredictable risks and/or possible complications that may result in a  catastrophic outcome. The patient indicated having understood very clearly. We have given the patient no guarantees and we have made no promises. Enough time was given to the patient to ask questions, all of which were answered to the patient's satisfaction. Mr. Hollins has indicated that he wanted to continue with the procedure. Attestation: I, the ordering provider, attest that I have discussed with the patient the benefits, risks, side-effects, alternatives, likelihood of achieving goals, and potential problems during recovery for the procedure that I have provided informed consent. Date  Time: 08/29/2022  9:18 AM  Pre-Procedure Preparation:  Monitoring: As per clinic protocol. Respiration, ETCO2, SpO2, BP, heart rate and rhythm monitor placed and checked for adequate function Safety Precautions: Patient was assessed for positional comfort and pressure points before starting the procedure. Time-out: I initiated and conducted the "Time-out" before starting the procedure, as per protocol. The patient was asked to participate by confirming the accuracy of the "Time Out" information. Verification of the correct person, site, and procedure were performed and confirmed by me, the nursing staff, and the patient. "Time-out" conducted as per Joint Commission's Universal Protocol (UP.01.01.01). Time:    Description/Narrative of Procedure:          Target: Epidural space via interlaminar opening, initially targeting the lower laminar border of the superior vertebral body. Region: Lumbar Approach: Percutaneous paravertebral  Rationale (medical necessity): procedure needed and proper for the diagnosis and/or treatment of the patient's medical symptoms and needs. Procedural Technique Safety Precautions: Aspiration looking for blood return was conducted prior to all injections. At no point did we inject any substances, as a needle was being advanced. No attempts were made at seeking any paresthesias. Safe injection  practices and needle disposal techniques used. Medications properly checked for expiration dates. SDV (single dose vial) medications used. Description of the Procedure: Protocol guidelines were followed. The procedure needle was introduced through the skin, ipsilateral to the reported pain, and advanced to the target area. Bone was contacted and the needle walked caudad,  until the lamina was cleared. The epidural space was identified using "loss-of-resistance technique" with 2-3 ml of PF-NaCl (0.9% NSS), in a 5cc LOR glass syringe.  Vitals:   08/29/22 0920  BP: (!) 138/93  Pulse: 66  Resp: 18  Temp: (!) 97 F (36.1 C)  TempSrc: Temporal  SpO2: 96%  Weight: 150 lb (68 kg)  Height: 5' 10"$  (1.778 m)    Start Time:   hrs. End Time:   hrs.  Imaging Guidance (Spinal):          Type of Imaging Technique: Fluoroscopy Guidance (Spinal) Indication(s): Assistance in needle guidance and placement for procedures requiring needle placement in or near specific anatomical locations not easily accessible without such assistance. Exposure Time: Please see nurses notes. Contrast: Before injecting any contrast, we confirmed that the patient did not have an allergy to iodine, shellfish, or radiological contrast. Once satisfactory needle placement was completed at the desired level, radiological contrast was injected. Contrast injected under live fluoroscopy. No contrast complications. See chart for type and volume of contrast used. Fluoroscopic Guidance: I was personally present during the use of fluoroscopy. "Tunnel Vision Technique" used to obtain the best possible view of the target area. Parallax error corrected before commencing the procedure. "Direction-depth-direction" technique used to introduce the needle under continuous pulsed fluoroscopy. Once target was reached, antero-posterior, oblique, and lateral fluoroscopic projection used confirm needle placement in all planes. Images permanently stored in  EMR. Interpretation: I personally interpreted the imaging intraoperatively. Adequate needle placement confirmed in multiple planes. Appropriate spread of contrast into desired area was observed. No evidence of afferent or efferent intravascular uptake. No intrathecal or subarachnoid spread observed. Permanent images saved into the patient's record.  Antibiotic Prophylaxis:   Anti-infectives (From admission, onward)    None      Indication(s): None identified  Post-operative Assessment:  Post-procedure Vital Signs:  Pulse/HCG Rate: 66  Temp: (!) 97 F (36.1 C) Resp: 18 BP: (!) 138/93 SpO2: 96 %  EBL: None  Complications: No immediate post-treatment complications observed by team, or reported by patient.  Note: The patient tolerated the entire procedure well. A repeat set of vitals were taken after the procedure and the patient was kept under observation following institutional policy, for this type of procedure. Post-procedural neurological assessment was performed, showing return to baseline, prior to discharge. The patient was provided with post-procedure discharge instructions, including a section on how to identify potential problems. Should any problems arise concerning this procedure, the patient was given instructions to immediately contact us, at any time, without hesitation. In any case, we plan to contact the patient by telephone for a follow-up status report regarding this interventional procedure.  Comments:  No additional relevant information.  Plan of Care  Orders:  No orders of the defined types were placed in this encounter.    Medications ordered for procedure: No orders of the defined types were placed in this encounter.  Medications administered: Nickelas Chevrier. Geerts had no medications administered during this visit.  See the medical record for exact dosing, route, and time of administration.  Follow-up plan:   No follow-ups on file.       Right L5/S1 06/01/22,  08/29/22   Recent Visits Date Type Provider Dept  07/07/22 Office Visit [name], MD [facility]  06/01/22 Procedure visit [name], MD [facility]  Showing recent visits within past 90 days and meeting all other requirements Today's Visits Date Type Provider Dept  08/29/22 Procedure visit Monserrate Blaschke,  Carlus Pavlov, MD [facility]  Showing today's visits and meeting all other requirements Future Appointments No visits were found meeting these conditions. Showing future appointments within next 90 days and meeting all other requirements  Disposition: Discharge home  Discharge (Date  Time): 08/29/2022;   hrs.   Primary Care Physician: [name], MD Location: [facility] Note by: [name], MD Date: 08/29/2022; Time: 9:37 AM  Disclaimer:  Medicine is not an exact science. The only guarantee in medicine is that nothing is guaranteed. It is important to note that the decision to proceed with this intervention was based on the information collected from the patient. The Data and conclusions were drawn from the patient's questionnaire, the interview, and the physical examination. Because the information was provided in large part by the patient, it cannot be guaranteed that it has not been purposely or unconsciously manipulated. Every effort has been made to obtain as much relevant data as possible for this evaluation. It is important to note that the conclusions that lead to this procedure are derived in large part from the available data. Always take into account that the treatment will also be dependent on availability of resources and existing treatment guidelines, considered by other Pain Management Practitioners as being common knowledge and practice, at the time of the intervention. For Medico-Legal purposes, it is also important to point out that variation in procedural techniques and pharmacological choices are the acceptable  norm. The indications, contraindications, technique, and results of the above procedure should only be interpreted and judged by a Board-Certified Interventional Pain Specialist with extensive familiarity and expertise in the same exact procedure and technique.

## 2022-08-29 NOTE — Patient Instructions (Signed)

## 2022-08-29 NOTE — Progress Notes (Signed)
Safety precautions to be maintained throughout the outpatient stay will include: orient to surroundings, keep bed in low position, maintain call bell within reach at all times, provide assistance with transfer out of bed and ambulation.  

## 2022-08-29 NOTE — Progress Notes (Signed)
PROVIDER NOTE: Interpretation of information contained herein should be left to medically-trained personnel. Specific patient instructions are provided elsewhere under "Patient Instructions" section of medical record. This document was created in part using STT-dictation technology, any transcriptional errors that may result from this process are unintentional.  Patient: Ronald Watts Type: Established DOB: Apr 12, 1966 MRN: [id number] PCP: [name], MD  Service: Procedure DOS: 08/29/2022 Setting: Ambulatory Location: Ambulatory outpatient facility Delivery: Face-to-face Provider: [name], MD Specialty: Interventional Pain Management Specialty designation: 09 Location: Outpatient facility Ref. Prov.: [name], MD       Interventional Therapy   Procedure: Lumbar trans-foraminal epidural steroid injection (L-TFESI) #1  Laterality: Right (-RT)  Level: L5 nerve root(s) Imaging: Fluoroscopy-guided         Anesthesia: Local anesthesia (1-2% Lidocaine) DOS: 08/29/2022  Performed by: [name], MD  Purpose: Diagnostic/Therapeutic Indications: Lumbar radicular pain severe enough to impact quality of life or function. 1. 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  From the lower tip of the scapula down to the tailbone and from flank to flank. Materials:  Tray: Block Needle(s):  Type: Spinal  Gauge (G): 22  Length: 3.5-in  Qty: 1      Pre-op H&P Assessment:  Mr. Kio is a 57 y.o. (year old), male patient, seen today for interventional treatment. He  has a past surgical history that includes Colonoscopy; Upper gi endoscopy; Cyst excision; Olecranon  bursectomy (Right, 01/29/2018); Hemorrhoid banding; Colonoscopy with propofol (N/A, 09/30/2021); and Esophagogastroduodenoscopy (egd) with propofol (N/A, 09/30/2021). Mr. Binger has a current medication list which includes the following prescription(s): cyclobenzaprine, eszopiclone, ferrous sulfate, gabapentin, gabapentin, ibuprofen, lisinopril, magnesium oxide, omeprazole, sildenafil, tramadol, atorvastatin, hydrochlorothiazide, and ondansetron, and the following Facility-Administered Medications: iopamidol. His primarily concern today is the Back Pain  Initial Vital Signs:  Pulse/HCG Rate: 66ECG Heart Rate: 65 Temp: (!) 97 F (36.1 C) Resp: 18 BP: (!) 138/93 SpO2: 96 %  BMI: Estimated body mass index is 21.52 kg/m as calculated from the following:   Height as of this encounter: 5' 10"$  (1.778 m).   Weight as of this encounter: 150 lb (68 kg).  Risk Assessment: Allergies: Reviewed. He has No Known Allergies.  Allergy Precautions: None required Coagulopathies: Reviewed. None identified.  Blood-thinner therapy: None at this time Active Infection(s): Reviewed. None identified. Mr. Stute is afebrile  Site Confirmation: Mr. Batdorf was asked to confirm the procedure and laterality before marking the site Procedure checklist: Completed Consent: Before the procedure and under the influence of no sedative(s), amnesic(s), or anxiolytics, the patient was informed of the treatment options, risks and possible complications. To fulfill our ethical and legal obligations, as recommended by the American Medical Association's Code of Ethics, I have informed the patient of my clinical impression; the nature and purpose of the treatment or procedure; the risks, benefits, and possible complications of the intervention; the alternatives, including doing nothing; the risk(s) and benefit(s) of the alternative treatment(s) or procedure(s); and the risk(s) and benefit(s) of doing nothing. The patient was provided  information about the general risks and possible complications associated with the procedure. These may include, but are not limited to: failure to achieve desired  goals, infection, bleeding, organ or nerve damage, allergic reactions, paralysis, and death. In addition, the patient was informed of those risks and complications associated to Spine-related procedures, such as failure to decrease pain; infection (i.e.: Meningitis, epidural or intraspinal abscess); bleeding (i.e.: epidural hematoma, subarachnoid hemorrhage, or any other type of intraspinal or peri-dural bleeding); organ or nerve damage (i.e.: Any type of peripheral nerve, nerve root, or spinal cord injury) with subsequent damage to sensory, motor, and/or autonomic systems, resulting in permanent pain, numbness, and/or weakness of one or several areas of the body; allergic reactions; (i.e.: anaphylactic reaction); and/or death. Furthermore, the patient was informed of those risks and complications associated with the medications. These include, but are not limited to: allergic reactions (i.e.: anaphylactic or anaphylactoid reaction(s)); adrenal axis suppression; blood sugar elevation that in diabetics may result in ketoacidosis or comma; water retention that in patients with history of congestive heart failure may result in shortness of breath, pulmonary edema, and decompensation with resultant heart failure; weight gain; swelling or edema; medication-induced neural toxicity; particulate matter embolism and blood vessel occlusion with resultant organ, and/or nervous system infarction; and/or aseptic necrosis of one or more joints. Finally, the patient was informed that Medicine is not an exact science; therefore, there is also the possibility of unforeseen or unpredictable risks and/or possible complications that may result in a catastrophic outcome. The patient indicated having understood very clearly. We have given the patient no guarantees and we  have made no promises. Enough time was given to the patient to ask questions, all of which were answered to the patient's satisfaction. Mr. Mcqueary has indicated that he wanted to continue with the procedure. Attestation: I, the ordering provider, attest that I have discussed with the patient the benefits, risks, side-effects, alternatives, likelihood of achieving goals, and potential problems during recovery for the procedure that I have provided informed consent. Date  Time: 08/29/2022  9:18 AM   Pre-Procedure Preparation:  Monitoring: As per clinic protocol. Respiration, ETCO2, SpO2, BP, heart rate and rhythm monitor placed and checked for adequate function Safety Precautions: Patient was assessed for positional comfort and pressure points before starting the procedure. Time-out: I initiated and conducted the "Time-out" before starting the procedure, as per protocol. The patient was asked to participate by confirming the accuracy of the "Time Out" information. Verification of the correct person, site, and procedure were performed and confirmed by me, the nursing staff, and the patient. "Time-out" conducted as per Joint Commission's Universal Protocol (UP.01.01.01). Time: 1000  Description/Narrative of Procedure:          Target: The 6 o'clock position under the pedicle, on the affected side. Region: Posterolateral Lumbosacral Approach: Posterior Percutaneous Paravertebral approach.  Rationale (medical necessity): procedure needed and proper for the diagnosis and/or treatment of the patient's medical symptoms and needs. Procedural Technique Safety Precautions: Aspiration looking for blood return was conducted prior to all injections. At no point did we inject any substances, as a needle was being advanced. No attempts were made at seeking any paresthesias. Safe injection practices and needle disposal techniques used. Medications properly checked for expiration dates. SDV (single dose vial)  medications used. Description of the Procedure: Protocol guidelines were followed. The patient was placed in position over the procedure table. The target area was identified and the area prepped in the usual manner. Skin & deeper tissues infiltrated with local anesthetic. Appropriate amount of time allowed to pass for local anesthetics to take effect. The procedure needles were then advanced to the target  area. Proper needle placement secured. Negative aspiration confirmed. Solution injected in intermittent fashion, asking for systemic symptoms every 0.5cc of injectate. The needles were then removed and the area cleansed, making sure to leave some of the prepping solution back to take advantage of its long term bactericidal properties.  3 cc solution made of 1 cc of preservative-free saline, 1 cc of 0.2% ropivacaine, 1 cc of Decadron 10 mg/cc.   Vitals:   08/29/22 1003 08/29/22 1008 08/29/22 1010 08/29/22 1013  BP: (!) 158/98 (!) 157/127 (!) 164/119 (!) 169/109  Pulse:      Resp: 11 10 14   $ Temp:      TempSrc:      SpO2: 99% 100% 96%   Weight:      Height:        Start Time: 1000 hrs. End Time: 1009 hrs.  Imaging Guidance (Spinal):          Type of Imaging Technique: Fluoroscopy Guidance (Spinal) Indication(s): Assistance in needle guidance and placement for procedures requiring needle placement in or near specific anatomical locations not easily accessible without such assistance. Exposure Time: Please see nurses notes. Contrast: Before injecting any contrast, we confirmed that the patient did not have an allergy to iodine, shellfish, or radiological contrast. Once satisfactory needle placement was completed at the desired level, radiological contrast was injected. Contrast injected under live fluoroscopy. No contrast complications. See chart for type and volume of contrast used. Fluoroscopic Guidance: I was personally present during the use of fluoroscopy. "Tunnel Vision Technique" used  to obtain the best possible view of the target area. Parallax error corrected before commencing the procedure. "Direction-depth-direction" technique used to introduce the needle under continuous pulsed fluoroscopy. Once target was reached, antero-posterior, oblique, and lateral fluoroscopic projection used confirm needle placement in all planes. Images permanently stored in EMR. Interpretation: I personally interpreted the imaging intraoperatively. Adequate needle placement confirmed in multiple planes. Appropriate spread of contrast into desired area was observed. No evidence of afferent or efferent intravascular uptake. No intrathecal or subarachnoid spread observed. Permanent images saved into the patient's record.  Post-operative Assessment:  Post-procedure Vital Signs:  Pulse/HCG Rate: 6699 Temp: (!) 97 F (36.1 C) Resp: 14 BP: (!) 169/109 (MD aware and instructed to f/up with PCP) SpO2: 96 %  EBL: None  Complications: No immediate post-treatment complications observed by team, or reported by patient.  Note: The patient tolerated the entire procedure well. A repeat set of vitals were taken after the procedure and the patient was kept under observation following institutional policy, for this type of procedure. Post-procedural neurological assessment was performed, showing return to baseline, prior to discharge. The patient was provided with post-procedure discharge instructions, including a section on how to identify potential problems. Should any problems arise concerning this procedure, the patient was given instructions to immediately contact us, at any time, without hesitation. In any case, we plan to contact the patient by telephone for a follow-up status report regarding this interventional procedure.  Comments:  No additional relevant information.  Plan of Care (POC)  Orders:  Orders Placed This Encounter  Procedures   DG PAIN CLINIC C-ARM 1-60 MIN NO REPORT    Intraoperative  interpretation by procedural physician at [place].    Standing Status:   Standing    Number of Occurrences:   1    Order Specific Question:   Reason for exam:    Answer:   Assistance in needle guidance and placement for procedures requiring needle  placement in or near specific anatomical locations not easily accessible without such assistance.    Medications ordered for procedure: Meds ordered this encounter  Medications   iopamidol (ISOVUE-M) 41 % intrathecal injection 10 mL    Must be Myelogram-compatible. If not available, you may substitute with a water-soluble, non-ionic, hypoallergenic, myelogram-compatible radiological contrast medium.   lidocaine (XYLOCAINE) 2 % (with pres) injection 400 mg   sodium chloride flush (NS) 0.9 % injection 1 mL   ropivacaine (PF) 2 mg/mL (0.2%) (NAROPIN) injection 1 mL   dexamethasone (DECADRON) injection 10 mg   Medications administered: We administered lidocaine, sodium chloride flush, ropivacaine (PF) 2 mg/mL (0.2%), and dexamethasone.  See the medical record for exact dosing, route, and time of administration.  Follow-up plan:   Return in about 4 weeks (around 09/26/2022), or PPE, VV.       Right L5/S1 06/01/22, R L5 TF ESI 08/29/22     Recent Visits Date Type Provider Dept  07/07/22 Office Visit [name], MD [facility]  06/01/22 Procedure visit [name], MD [facility]  Showing recent visits within past 90 days and meeting all other requirements Today's Visits Date Type Provider Dept  08/29/22 Appointment [name], MD [facility]  08/29/22 Procedure visit [name], MD [facility]  Showing today's visits and meeting all other requirements Future Appointments No visits were found meeting these conditions. Showing future appointments within next 90 days and meeting all other requirements  Disposition: Discharge home  Discharge (Date  Time): 08/29/2022; 1019 hrs.    Primary Care Physician: [name], MD Location: [facility] Note by: [name], MD (TTS technology used. I apologize for any typographical errors that were not detected and corrected.) Date: 08/29/2022; Time: 10:20 AM  Disclaimer:  Medicine is not an Chief Strategy Officer. The only guarantee in medicine is that nothing is guaranteed. It is important to note that the decision to proceed with this intervention was based on the information collected from the patient. The Data and conclusions were drawn from the patient's questionnaire, the interview, and the physical examination. Because the information was provided in large part by the patient, it cannot be guaranteed that it has not been purposely or unconsciously manipulated. Every effort has been made to obtain as much relevant data as possible for this evaluation. It is important to note that the conclusions that lead to this procedure are derived in large part from the available data. Always take into account that the treatment will also be dependent on availability of resources and existing treatment guidelines, considered by other Pain Management Practitioners as being common knowledge and practice, at the time of the intervention. For Medico-Legal purposes, it is also important to point out that variation in procedural techniques and pharmacological choices are the acceptable norm. The indications, contraindications, technique, and results of the above procedure should only be interpreted and judged by a Board-Certified Interventional Pain Specialist with extensive familiarity and expertise in the same exact procedure and technique.

## 2022-08-30 ENCOUNTER — Telehealth: Payer: Self-pay

## 2022-08-30 NOTE — Telephone Encounter (Signed)
Post procedure follow up.  Patients wife states he is doing good.

## 2022-09-27 ENCOUNTER — Ambulatory Visit
Payer: Medicare Other | Attending: Student in an Organized Health Care Education/Training Program | Admitting: Student in an Organized Health Care Education/Training Program

## 2022-09-27 DIAGNOSIS — Z716 Tobacco abuse counseling: Secondary | ICD-10-CM

## 2022-09-27 DIAGNOSIS — M5416 Radiculopathy, lumbar region: Secondary | ICD-10-CM | POA: Diagnosis not present

## 2022-09-27 DIAGNOSIS — G8929 Other chronic pain: Secondary | ICD-10-CM | POA: Diagnosis not present

## 2022-09-27 DIAGNOSIS — M48061 Spinal stenosis, lumbar region without neurogenic claudication: Secondary | ICD-10-CM

## 2022-09-27 NOTE — Progress Notes (Signed)
Patient: Ronald Watts  Service Category: E/M  Provider: [name], MD  DOB: 08-27-1965  DOS: 09/27/2022  Location: Office  MRN: [id number]  Setting: Ambulatory outpatient  Referring Provider: [name], MD  Type: Established Patient  Specialty: Interventional Pain Management  PCP: [name], MD  Location: Remote location  Delivery: TeleHealth     Virtual Encounter - Pain Management PROVIDER NOTE: Information contained herein reflects review and annotations entered in association with encounter. Interpretation of such information and data should be left to medically-trained personnel. Information provided to patient can be located elsewhere in the medical record under "Patient Instructions". Document created using STT-dictation technology, any transcriptional errors that may result from process are unintentional.    Contact & Pharmacy Preferred: [place]: [phone] (home) Mobile: [phone] (mobile) E-mail: [email or web address]  [id number] - [place], [place] - [address] [place] [address] Phone: [phone] Fax: [phone]   Pre-screening  Ronald Watts offered "in-person" vs "virtual" encounter. He indicated preferring virtual for this encounter.   Reason COVID-19*  Social distancing based on CDC and AMA recommendations.   I contacted Ronald Watts on 09/27/2022 via telephone.      I clearly identified myself as [name], MD. I verified that I was speaking with the correct person using two identifiers (Name: Ronald Watts, and date of birth: 01-13-1966).  Consent I sought verbal advanced consent from Ronald Watts for virtual visit interactions. I informed Ronald Watts of possible security and privacy concerns, risks, and limitations associated with providing "not-in-person" medical evaluation and management services. I also informed Ronald Watts of the availability of "in-person" appointments. Finally, I informed him that there would be a charge for  the virtual visit and that he could be  personally, fully or partially, financially responsible for it. Ronald Watts expressed understanding and agreed to proceed.   Historic Elements   Ronald Watts is a 57 y.o. year old, male patient evaluated today after our last contact on 08/29/2022. Ronald Watts  has a past medical history of Alcohol-induced acute pancreatitis, Anemia, Barrett's syndrome, COPD (chronic obstructive pulmonary disease) ([place]), ED (erectile dysfunction), GERD (gastroesophageal reflux disease), Hepatitis, History of blood transfusion, HLD (hyperlipidemia), Hypertension, Knee pain, right, Sciatica, and Stroke ([place]). He also  has a past surgical history that includes Colonoscopy; Upper gi endoscopy; Cyst excision; Olecranon bursectomy (Right, 01/29/2018); Hemorrhoid banding; Colonoscopy with propofol (N/A, 09/30/2021); and Esophagogastroduodenoscopy (egd) with propofol (N/A, 09/30/2021). Ronald Watts has a current medication list which includes the following prescription(s): cyclobenzaprine, eszopiclone, ferrous sulfate, gabapentin, gabapentin, hydrochlorothiazide, ibuprofen, magnesium oxide, omeprazole, ondansetron, sildenafil, and tramadol. He  reports that he has been smoking cigarettes. He has a 30.00 pack-year smoking history. He has never used smokeless tobacco. He reports current alcohol use of about 6.0 standard drinks of alcohol per week. He reports current drug use. Drug: Marijuana. Ronald Watts has No Known Allergies.  BMI: Estimated body mass index is 21.52 kg/m as calculated from the following:   Height as of 08/29/22: 5\' 10"  (1.778 m).   Weight as of 08/29/22: 150 lb (68 kg). Last encounter: 07/07/2022. Last procedure: 08/29/2022.  HPI  Today, he is being contacted for a post-procedure assessment.   Post-procedure evaluation   Procedure: Lumbar trans-foraminal epidural steroid injection (L-TFESI) #1  Laterality: Right (-RT)  Level: L5 nerve root(s) Imaging: Fluoroscopy-guided          Anesthesia: Local anesthesia (1-2% Lidocaine) DOS: 08/29/2022  Performed by: [name],  MD  Purpose: Diagnostic/Therapeutic Indications: Lumbar radicular pain severe enough to impact quality of life or function. 1. 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)    NAS-11 Pain score:   Pre-procedure: 9 /10   Post-procedure: 8 /10   Effectiveness:  Initial hour after procedure: 100 %  Subsequent 4-6 hours post-procedure: 100 %  Analgesia past initial 6 hours: 65 % (2 days)  Ongoing improvement:  Analgesic:   Function: Ronald Watts reports improvement in function ROM: Back to baseline   Laboratory Chemistry Profile   Renal Lab Results  Component Value Date   BUN 14 05/09/2021   CREATININE 1.12 05/09/2021   GFRNONAA >60 05/09/2021    Hepatic Lab Results  Component Value Date   AST 33 05/09/2021   ALT 38 05/09/2021   ALBUMIN 3.3 (L) 05/09/2021   ALKPHOS 83 05/09/2021   HCVAB NON REACTIVE 05/03/2021   LIPASE 65 (H) 05/09/2021   AMMONIA 18 05/03/2021    Electrolytes Lab Results  Component Value Date   NA 138 05/09/2021   K 4.2 05/09/2021   CL 104 05/09/2021   CALCIUM 9.4 05/09/2021   MG 1.9 05/09/2021   PHOS 4.3 05/09/2021    Bone No results found for: "VD25OH", "VD125OH2TOT", "[id number]", "[id number]", "25OHVITD1", "25OHVITD2", "25OHVITD3", "TESTOFREE", "TESTOSTERONE"  Inflammation (CRP: Acute Phase) (ESR: Chronic Phase) No results found for: "CRP", "ESRSEDRATE", "LATICACIDVEN"       Note: Above Lab results reviewed.   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  Plan of Care  Patient found benefit with his right L5 transforaminal epidural steroid injection more so than his interlaminar injection that was done previously.  He would  like to continue with injections however I informed him that we need to space these so that we are not doing them more frequently than 3 months.  Patient endorsed understanding, I will see him back in mid May for repeat right L5 transforaminal ESI.   Orders:  Orders Placed This Encounter  Procedures   Lumbar Transforaminal Epidural    Standing Status:   Future    Standing Expiration Date:   12/28/2022    Scheduling Instructions:     Laterality: RIGHT L5            Sedation: Patient's choice.     Timeframe: mid May    Order Specific Question:   Where will this procedure be performed?    Answer:   [facility]   Follow-up plan:   Return in about 2 months (around 11/30/2022) for Right L5 transforaminal ESI.      Right L5/S1 06/01/22, R L5 TF ESI 08/29/22      Recent Visits Date Type Provider Dept  08/29/22 Procedure visit [name], MD [facility]  07/07/22 Office Visit [name], MD [facility]  Showing recent visits within past 90 days and meeting all other requirements Today's Visits Date Type Provider Dept  09/27/22 Office Visit [name], MD [facility]  Showing today's visits and meeting all other requirements Future Appointments No visits were found meeting these conditions. Showing future appointments within next 90 days and meeting all other requirements  I discussed the assessment and treatment plan with the patient. The patient was provided an opportunity to ask questions and all were answered. The patient agreed  with the plan and demonstrated an understanding of the instructions.  Patient advised to call back or seek an in-person evaluation if the symptoms or condition worsens.  Duration of encounter: 22minutes.  Note by: [name], MD Date: 09/27/2022; Time: 3:13 PM

## 2022-11-30 ENCOUNTER — Ambulatory Visit
Payer: Medicare Other | Attending: Student in an Organized Health Care Education/Training Program | Admitting: Student in an Organized Health Care Education/Training Program

## 2022-11-30 ENCOUNTER — Ambulatory Visit
Admission: RE | Admit: 2022-11-30 | Discharge: 2022-11-30 | Disposition: A | Discharge status: Home / Self Care | Payer: Medicare Other | Source: Ambulatory Visit | Attending: Student in an Organized Health Care Education/Training Program | Admitting: Student in an Organized Health Care Education/Training Program

## 2022-11-30 ENCOUNTER — Encounter: Payer: Self-pay | Admitting: Student in an Organized Health Care Education/Training Program

## 2022-11-30 DIAGNOSIS — M5416 Radiculopathy, lumbar region: Secondary | ICD-10-CM

## 2022-11-30 DIAGNOSIS — M48061 Spinal stenosis, lumbar region without neurogenic claudication: Secondary | ICD-10-CM | POA: Diagnosis present

## 2022-11-30 DIAGNOSIS — G8929 Other chronic pain: Secondary | ICD-10-CM | POA: Diagnosis present

## 2022-11-30 MED ORDER — DEXAMETHASONE SODIUM PHOSPHATE 10 MG/ML IJ SOLN
10.0000 mg | Freq: Once | INTRAMUSCULAR | Status: AC
  Administered 2022-11-30: 10 mg
  Filled 2022-11-30: qty 1

## 2022-11-30 MED ORDER — DIAZEPAM 5 MG PO TABS
5.0000 mg | ORAL_TABLET | ORAL | Status: AC
  Administered 2022-11-30: 5 mg via ORAL

## 2022-11-30 MED ORDER — ROPIVACAINE HCL 2 MG/ML IJ SOLN
1.0000 mL | Freq: Once | INTRAMUSCULAR | Status: AC
  Administered 2022-11-30: 1 mL via EPIDURAL
  Filled 2022-11-30: qty 20

## 2022-11-30 MED ORDER — IOHEXOL 180 MG/ML  SOLN
10.0000 mL | Freq: Once | INTRAMUSCULAR | Status: AC
  Administered 2022-11-30: 10 mL via EPIDURAL
  Filled 2022-11-30: qty 20

## 2022-11-30 MED ORDER — DIAZEPAM 5 MG PO TABS
ORAL_TABLET | ORAL | Status: AC
Start: 2022-11-30 — End: ?
  Filled 2022-11-30: qty 1

## 2022-11-30 MED ORDER — SODIUM CHLORIDE 0.9% FLUSH
1.0000 mL | Freq: Once | INTRAVENOUS | Status: AC
  Administered 2022-11-30: 1 mL

## 2022-11-30 MED ORDER — LIDOCAINE HCL 2 % IJ SOLN
20.0000 mL | Freq: Once | INTRAMUSCULAR | Status: AC
  Administered 2022-11-30: 100 mg
  Filled 2022-11-30: qty 40

## 2022-11-30 NOTE — Progress Notes (Signed)
Safety precautions to be maintained throughout the outpatient stay will include: orient to surroundings, keep bed in low position, maintain call bell within reach at all times, provide assistance with transfer out of bed and ambulation.  

## 2022-11-30 NOTE — Patient Instructions (Signed)
____________________________________________________________________________________________  Post-Procedure Discharge Instructions  Instructions: Apply ice:  Purpose: This will minimize any swelling and discomfort after procedure.  When: Day of procedure, as soon as you get home. How: Fill a plastic sandwich bag with crushed ice. Cover it with a small towel and apply to injection site. How long: (15 min on, 15 min off) Apply for 15 minutes then remove x 15 minutes.  Repeat sequence on day of procedure, until you go to bed. Apply heat:  Purpose: To treat any soreness and discomfort from the procedure. When: Starting the next day after the procedure. How: Apply heat to procedure site starting the day following the procedure. How long: May continue to repeat daily, until discomfort goes away. Food intake: Start with clear liquids (like water) and advance to regular food, as tolerated.  Physical activities: Keep activities to a minimum for the first 8 hours after the procedure. After that, then as tolerated. Driving: If you have received any sedation, be responsible and do not drive. You are not allowed to drive for 24 hours after having sedation. Blood thinner: (Applies only to those taking blood thinners) You may restart your blood thinner 6 hours after your procedure. Insulin: (Applies only to Diabetic patients taking insulin) As soon as you can eat, you may resume your normal dosing schedule. Infection prevention: Keep procedure site clean and dry. Shower daily and clean area with soap and water. Post-procedure Pain Diary: Extremely important that this be done correctly and accurately. Recorded information will be used to determine the next step in treatment. For the purpose of accuracy, follow these rules: Evaluate only the area treated. Do not report or include pain from an untreated area. For the purpose of this evaluation, ignore all other areas of pain, except for the treated area. After  your procedure, avoid taking a long nap and attempting to complete the pain diary after you wake up. Instead, set your alarm clock to go off every hour, on the hour, for the initial 8 hours after the procedure. Document the duration of the numbing medicine, and the relief you are getting from it. Do not go to sleep and attempt to complete it later. It will not be accurate. If you received sedation, it is likely that you were given a medication that may cause amnesia. Because of this, completing the diary at a later time may cause the information to be inaccurate. This information is needed to plan your care. Follow-up appointment: Keep your post-procedure follow-up evaluation appointment after the procedure (usually 2 weeks for most procedures, 6 weeks for radiofrequencies). DO NOT FORGET to bring you pain diary with you.   Expect: (What should I expect to see with my procedure?) From numbing medicine (AKA: Local Anesthetics): Numbness or decrease in pain. You may also experience some weakness, which if present, could last for the duration of the local anesthetic. Onset: Full effect within 15 minutes of injected. Duration: It will depend on the type of local anesthetic used. On the average, 1 to 8 hours.  From steroids (Applies only if steroids were used): Decrease in swelling or inflammation. Once inflammation is improved, relief of the pain will follow. Onset of benefits: Depends on the amount of swelling present. The more swelling, the longer it will take for the benefits to be seen. In some cases, up to 10 days. Duration: Steroids will stay in the system x 2 weeks. Duration of benefits will depend on multiple posibilities including persistent irritating factors. Side-effects: If present, they   may typically last 2 weeks (the duration of the steroids). Frequent: Cramps (if they occur, drink Gatorade and take over-the-counter Magnesium 450-500 mg once to twice a day); water retention with temporary  weight gain; increases in blood sugar; decreased immune system response; increased appetite. Occasional: Facial flushing (red, warm cheeks); mood swings; menstrual changes. Uncommon: Long-term decrease or suppression of natural hormones; bone thinning. (These are more common with higher doses or more frequent use. This is why we prefer that our patients avoid having any injection therapies in other practices.)  Very Rare: Severe mood changes; psychosis; aseptic necrosis. From procedure: Some discomfort is to be expected once the numbing medicine wears off. This should be minimal if ice and heat are applied as instructed.  Call if: (When should I call?) You experience numbness and weakness that gets worse with time, as opposed to wearing off. New onset bowel or bladder incontinence. (Applies only to procedures done in the spine)  Emergency Numbers: Durning business hours (Monday - Thursday, 8:00 AM - 4:00 PM) (Friday, 9:00 AM - 12:00 Noon): [phone] After hours: [phone] NOTE: If you are having a problem and are unable connect with, or to talk to a provider, then go to your nearest urgent care or emergency department. If the problem is serious and urgent, please call 911. ____________________________________________________________________________________________  Selective Nerve Root Block Patient Information  Description: Specific nerve roots exit the spinal canal and these nerves can be compressed and inflamed by a bulging disc and bone spurs.  By injecting steroids on the nerve root, we can potentially decrease the inflammation surrounding these nerves, which often leads to decreased pain.  Also, by injecting local anesthesia on the nerve root, this can provide us helpful information to give to your referring doctor if it decreases your pain.  Selective nerve root blocks can be done along the spine from the neck to the low back depending on the location of your pain.   After numbing  the skin with local anesthesia, a small needle is passed to the nerve root and the position of the needle is verified using x-ray pictures.  After the needle is in correct position, we then deposit the medication.  You may experience a pressure sensation while this is being done.  The entire block usually lasts less than 15 minutes.  Conditions that may be treated with selective nerve root blocks: Low back and leg pain Spinal stenosis Diagnostic block prior to potential surgery Neck and arm pain Post laminectomy syndrome  Preparation for the injection:  Do not eat any solid food or dairy products within 8 hours of your appointment. You may drink clear liquids up to 3 hours before an appointment.  Clear liquids include water, black coffee, juice or soda.  No milk or cream please. You may take your regular medications, including pain medications, with a sip of water before your appointment.  Diabetics should hold regular insulin (if taken separately) and take 1/2 normal NPH dose the morning of the procedure.  Carry some sugar containing items with you to your appointment. A driver must accompany you and be prepared to drive you home after your procedure. Bring all your current medications with you. An IV may be inserted and sedation may be given at the discretion of the physician. A blood pressure cuff, EKG, and other monitors will often be applied during the procedure.  Some patients may need to have extra oxygen administered for a short period. You will be asked to provide   medical information, including allergies, prior to the procedure.  We must know immediately if you are taking blood  Thinners (like Coumadin) or if you are allergic to IV iodine contrast (dye).  Possible side-effects: All are usually temporary Bleeding from needle site Light headedness Numbness and tingling Decreased blood pressure Weakness in arms/legs Pressure sensation in back/neck Pain at injection site (several  days)  Possible complications: All are extremely rare Infection Nerve injury Spinal headache (a headache wore with upright position)  Call if you experience: Fever/chills associated with headache or increased back/neck pain Headache worsened by an upright position New onset weakness or numbness of an extremity below the injection site Hives or difficulty breathing (go to the emergency room) Inflammation or drainage at the injection site(s) Severe back/neck pain greater than usual New symptoms which are concerning to you  Please note:  Although the local anesthetic injected can often make your back or neck feel good for several hours after the injection the pain will likely return.  It takes 3-5 days for steroids to work on the nerve root. You may not notice any pain relief for at least one week.  If effective, we will often do a series of 3 injections spaced 3-6 weeks apart to maximally decrease your pain.    If you have any questions, please call [phone] [facility] 

## 2022-11-30 NOTE — Progress Notes (Signed)
PROVIDER NOTE: Interpretation of information contained herein should be left to medically-trained personnel. Specific patient instructions are provided elsewhere under "Patient Instructions" section of medical record. This document was created in part using STT-dictation technology, any transcriptional errors that may result from this process are unintentional.  Patient: Ronald Watts Type: Established DOB: 06/05/1966 MRN: [id number] PCP: Leanna Sato, MD  Service: Procedure DOS: 11/30/2022 Setting: Ambulatory Location: Ambulatory outpatient facility Delivery: Face-to-face Provider: Edward Jolly, MD Specialty: Interventional Pain Management Specialty designation: 09 Location: Outpatient facility Ref. Prov.: Edward Jolly, MD       Interventional Therapy   Procedure: Lumbar trans-foraminal epidural steroid injection (L-TFESI) #2  Laterality: Right (-RT)  Level: L5 nerve root(s) Imaging: Fluoroscopy-guided         Anesthesia: Local anesthesia (1-2% Lidocaine) with 5 mg PO Valium DOS: 11/30/2022  Performed by: Edward Jolly, MD  Purpose: Diagnostic/Therapeutic Indications: Lumbar radicular pain severe enough to impact quality of life or function. 1. 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  From the lower tip of the scapula down to the tailbone and from flank to flank. Materials:  Tray: Block Needle(s):  Type: Spinal  Gauge (G): 22  Length: 3.5-in  Qty: 1      Pre-op H&P Assessment:  [name] is a 57 y.o. (year old), male patient, seen today for interventional treatment. He  has a past surgical history that includes Colonoscopy; Upper gi  endoscopy; Cyst excision; Olecranon bursectomy (Right, 01/29/2018); Hemorrhoid banding; Colonoscopy with propofol (N/A, 09/30/2021); and Esophagogastroduodenoscopy (egd) with propofol (N/A, 09/30/2021). [name] has a current medication list which includes the following prescription(s): cyclobenzaprine, eszopiclone, ferrous sulfate, gabapentin, gabapentin, ibuprofen, lisinopril, magnesium oxide, omeprazole, ondansetron, sildenafil, tramadol, and hydrochlorothiazide. His primarily concern today is the Back Pain  Initial Vital Signs:  Pulse/HCG Rate: 80ECG Heart Rate: 77 Temp: 97.8 F (36.6 C) Resp: 18 BP: (!) 170/115 SpO2: 96 %  BMI: Estimated body mass index is 20.81 kg/m as calculated from the following:   Height as of this encounter: 5\' 10"  (1.778 m).   Weight as of this encounter: 145 lb (65.8 kg).  Risk Assessment: Allergies: Reviewed. He has No Known Allergies.  Allergy Precautions: None required Coagulopathies: Reviewed. None identified.  Blood-thinner therapy: None at this time Active Infection(s): Reviewed. None identified. Ronald Watts is afebrile  Site Confirmation: Ronald Watts was asked to confirm the procedure and laterality before marking the site Procedure checklist: Completed Consent: Before the procedure and under the influence of no sedative(s), amnesic(s), or anxiolytics, the patient was informed of the treatment options, risks and possible complications. To fulfill our ethical and legal obligations, as recommended by the American Medical Association's Code of Ethics, I have informed the patient of my clinical impression; the nature and purpose of the treatment or procedure; the risks, benefits, and possible complications of the intervention; the alternatives, including doing nothing; the risk(s) and benefit(s) of the alternative treatment(s) or procedure(s); and the risk(s) and benefit(s) of doing nothing. The patient was provided information about the general risks and possible  complications associated with the procedure. These may include, but are not limited to: failure to achieve desired goals, infection, bleeding,  organ or nerve damage, allergic reactions, paralysis, and death. In addition, the patient was informed of those risks and complications associated to Spine-related procedures, such as failure to decrease pain; infection (i.e.: Meningitis, epidural or intraspinal abscess); bleeding (i.e.: epidural hematoma, subarachnoid hemorrhage, or any other type of intraspinal or peri-dural bleeding); organ or nerve damage (i.e.: Any type of peripheral nerve, nerve root, or spinal cord injury) with subsequent damage to sensory, motor, and/or autonomic systems, resulting in permanent pain, numbness, and/or weakness of one or several areas of the body; allergic reactions; (i.e.: anaphylactic reaction); and/or death. Furthermore, the patient was informed of those risks and complications associated with the medications. These include, but are not limited to: allergic reactions (i.e.: anaphylactic or anaphylactoid reaction(s)); adrenal axis suppression; blood sugar elevation that in diabetics may result in ketoacidosis or comma; water retention that in patients with history of congestive heart failure may result in shortness of breath, pulmonary edema, and decompensation with resultant heart failure; weight gain; swelling or edema; medication-induced neural toxicity; particulate matter embolism and blood vessel occlusion with resultant organ, and/or nervous system infarction; and/or aseptic necrosis of one or more joints. Finally, the patient was informed that Medicine is not an exact science; therefore, there is also the possibility of unforeseen or unpredictable risks and/or possible complications that may result in a catastrophic outcome. The patient indicated having understood very clearly. We have given the patient no guarantees and we have made no promises. Enough time was given to the  patient to ask questions, all of which were answered to the patient's satisfaction. Ronald Watts has indicated that he wanted to continue with the procedure. Attestation: I, the ordering provider, attest that I have discussed with the patient the benefits, risks, side-effects, alternatives, likelihood of achieving goals, and potential problems during recovery for the procedure that I have provided informed consent. Date  Time: 11/30/2022  9:48 AM   Pre-Procedure Preparation:  Monitoring: As per clinic protocol. Respiration, ETCO2, SpO2, BP, heart rate and rhythm monitor placed and checked for adequate function Safety Precautions: Patient was assessed for positional comfort and pressure points before starting the procedure. Time-out: I initiated and conducted the "Time-out" before starting the procedure, as per protocol. The patient was asked to participate by confirming the accuracy of the "Time Out" information. Verification of the correct person, site, and procedure were performed and confirmed by me, the nursing staff, and the patient. "Time-out" conducted as per Joint Commission's Universal Protocol (UP.01.01.01). Time: 1037  Description/Narrative of Procedure:          Target: The 6 o'clock position under the pedicle, on the affected side. Region: Posterolateral Lumbosacral Approach: Posterior Percutaneous Paravertebral approach.  Rationale (medical necessity): procedure needed and proper for the diagnosis and/or treatment of the patient's medical symptoms and needs. Procedural Technique Safety Precautions: Aspiration looking for blood return was conducted prior to all injections. At no point did we inject any substances, as a needle was being advanced. No attempts were made at seeking any paresthesias. Safe injection practices and needle disposal techniques used. Medications properly checked for expiration dates. SDV (single dose vial) medications used. Description of the Procedure: Protocol  guidelines were followed. The patient was placed in position over the procedure table. The target area was identified and the area prepped in the usual manner. Skin & deeper tissues infiltrated with local anesthetic. Appropriate amount of time allowed to pass for local anesthetics to take effect. The procedure needles were then advanced to the target area. Proper needle  placement secured. Negative aspiration confirmed. Solution injected in intermittent fashion, asking for systemic symptoms every 0.5cc of injectate. The needles were then removed and the area cleansed, making sure to leave some of the prepping solution back to take advantage of its long term bactericidal properties.  3 cc solution made of 1 cc of preservative-free saline, 1 cc of 0.2% ropivacaine, 1 cc of Decadron 10 mg/cc.   Vitals:   11/30/22 1003 11/30/22 1035 11/30/22 1040 11/30/22 1045  BP: (!) 146/107 (!) 138/103 (!) 139/101 (!) 138/98  Pulse:      Resp:  18 16 15   Temp:      TempSrc:      SpO2:  97% 96% 97%  Weight:      Height:        Start Time: 1037 hrs. End Time: 1045 hrs.  Imaging Guidance (Spinal):          Type of Imaging Technique: Fluoroscopy Guidance (Spinal) Indication(s): Assistance in needle guidance and placement for procedures requiring needle placement in or near specific anatomical locations not easily accessible without such assistance. Exposure Time: Please see nurses notes. Contrast: Before injecting any contrast, we confirmed that the patient did not have an allergy to iodine, shellfish, or radiological contrast. Once satisfactory needle placement was completed at the desired level, radiological contrast was injected. Contrast injected under live fluoroscopy. No contrast complications. See chart for type and volume of contrast used. Fluoroscopic Guidance: I was personally present during the use of fluoroscopy. "Tunnel Vision Technique" used to obtain the best possible view of the target area.  Parallax error corrected before commencing the procedure. "Direction-depth-direction" technique used to introduce the needle under continuous pulsed fluoroscopy. Once target was reached, antero-posterior, oblique, and lateral fluoroscopic projection used confirm needle placement in all planes. Images permanently stored in EMR. Interpretation: I personally interpreted the imaging intraoperatively. Adequate needle placement confirmed in multiple planes. Appropriate spread of contrast into desired area was observed. No evidence of afferent or efferent intravascular uptake. No intrathecal or subarachnoid spread observed. Permanent images saved into the patient's record.  Post-operative Assessment:  Post-procedure Vital Signs:  Pulse/HCG Rate: 8075 Temp: 97.8 F (36.6 C) Resp: 15 BP: (!) 138/98 SpO2: 97 %  EBL: None  Complications: No immediate post-treatment complications observed by team, or reported by patient.  Note: The patient tolerated the entire procedure well. A repeat set of vitals were taken after the procedure and the patient was kept under observation following institutional policy, for this type of procedure. Post-procedural neurological assessment was performed, showing return to baseline, prior to discharge. The patient was provided with post-procedure discharge instructions, including a section on how to identify potential problems. Should any problems arise concerning this procedure, the patient was given instructions to immediately contact us, at any time, without hesitation. In any case, we plan to contact the patient by telephone for a follow-up status report regarding this interventional procedure.  Comments:  No additional relevant information.  Plan of Care (POC)  Orders:  Orders Placed This Encounter  Procedures   DG PAIN CLINIC C-ARM 1-60 MIN NO REPORT    Intraoperative interpretation by procedural physician at [facility].    Standing Status:   Standing     Number of Occurrences:   1    Order Specific Question:   Reason for exam:    Answer:   Assistance in needle guidance and placement for procedures requiring needle placement in or near specific anatomical locations not easily accessible without such  assistance.    Medications ordered for procedure: Meds ordered this encounter  Medications   iohexol (OMNIPAQUE) 180 MG/ML injection 10 mL    Must be Myelogram-compatible. If not available, you may substitute with a water-soluble, non-ionic, hypoallergenic, myelogram-compatible radiological contrast medium.   lidocaine (XYLOCAINE) 2 % (with pres) injection 400 mg   diazepam (VALIUM) tablet 5 mg    Make sure Flumazenil is available in the pyxis when using this medication. If oversedation occurs, administer 0.2 mg IV over 15 sec. If after 45 sec no response, administer 0.2 mg again over 1 min; may repeat at 1 min intervals; not to exceed 4 doses (1 mg)   sodium chloride flush (NS) 0.9 % injection 1 mL   dexamethasone (DECADRON) injection 10 mg   ropivacaine (PF) 2 mg/mL (0.2%) (NAROPIN) injection 1 mL   Medications administered: We administered iohexol, lidocaine, diazepam, sodium chloride flush, dexamethasone, and ropivacaine (PF) 2 mg/mL (0.2%).  See the medical record for exact dosing, route, and time of administration.  Follow-up plan:   Return in about 6 weeks (around 01/11/2023) for Post Procedure Evaluation, virtual.       Right L5/S1 06/01/22, R L5 TF ESI 08/29/22, 11/30/22     Recent Visits Date Type Provider Dept  09/27/22 Office Visit Edward Jolly, MD [facility]  Showing recent visits within past 90 days and meeting all other requirements Today's Visits Date Type Provider Dept  11/30/22 Procedure visit Edward Jolly, MD [facility]  Showing today's visits and meeting all other requirements Future Appointments Date Type Provider Dept  01/11/23 Appointment Edward Jolly, MD [facility]  Showing  future appointments within next 90 days and meeting all other requirements  Disposition: Discharge home  Discharge (Date  Time): 11/30/2022; 1055 hrs.   Primary Care Physician: Leanna Sato, MD Location: [facility] Note by: Edward Jolly, MD (TTS technology used. I apologize for any typographical errors that were not detected and corrected.) Date: 11/30/2022; Time: 11:27 AM  Disclaimer:  Medicine is not an Visual merchandiser. The only guarantee in medicine is that nothing is guaranteed. It is important to note that the decision to proceed with this intervention was based on the information collected from the patient. The Data and conclusions were drawn from the patient's questionnaire, the interview, and the physical examination. Because the information was provided in large part by the patient, it cannot be guaranteed that it has not been purposely or unconsciously manipulated. Every effort has been made to obtain as much relevant data as possible for this evaluation. It is important to note that the conclusions that lead to this procedure are derived in large part from the available data. Always take into account that the treatment will also be dependent on availability of resources and existing treatment guidelines, considered by other Pain Management Practitioners as being common knowledge and practice, at the time of the intervention. For Medico-Legal purposes, it is also important to point out that variation in procedural techniques and pharmacological choices are the acceptable norm. The indications, contraindications, technique, and results of the above procedure should only be interpreted and judged by a Board-Certified Interventional Pain Specialist with extensive familiarity and expertise in the same exact procedure and technique.

## 2022-12-01 ENCOUNTER — Telehealth: Payer: Self-pay

## 2022-12-01 NOTE — Telephone Encounter (Signed)
Post procedure follow up.  Voicemail has not been set up.  Unable to leave a message.  

## 2023-01-10 ENCOUNTER — Telehealth: Payer: Self-pay

## 2023-01-10 NOTE — Telephone Encounter (Signed)
Called patient for PPE on 7/3 . Wife answered, Mr Ronald Watts not at home. Instructed to call back

## 2023-01-11 ENCOUNTER — Encounter: Payer: Self-pay | Admitting: Student in an Organized Health Care Education/Training Program

## 2023-01-11 ENCOUNTER — Ambulatory Visit
Payer: Medicare Other | Attending: Student in an Organized Health Care Education/Training Program | Admitting: Student in an Organized Health Care Education/Training Program

## 2023-01-11 DIAGNOSIS — M48061 Spinal stenosis, lumbar region without neurogenic claudication: Secondary | ICD-10-CM

## 2023-01-11 NOTE — Progress Notes (Signed)
I attempted to call the patient however no response (on both home phone and cell phone).  -Dr Cherylann Ratel     Post-procedure evaluation   Procedure: Lumbar trans-foraminal epidural steroid injection (L-TFESI) #2  Laterality: Right (-RT)  Level: L5 nerve root(s) Imaging: Fluoroscopy-guided         Anesthesia: Local anesthesia (1-2% Lidocaine) with 5 mg PO Valium DOS: 11/30/2022  Performed by: Edward Jolly, MD  Purpose: Diagnostic/Therapeutic Indications: Lumbar radicular pain severe enough to impact quality of life or function. 1. 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

## 2023-05-02 LAB — LAB REPORT - SCANNED: EGFR: 76

## 2023-06-15 ENCOUNTER — Ambulatory Visit: Payer: Medicare Other | Admitting: Student in an Organized Health Care Education/Training Program

## 2023-07-11 ENCOUNTER — Ambulatory Visit: Payer: Medicare Other | Admitting: Student in an Organized Health Care Education/Training Program

## 2023-07-11 ENCOUNTER — Telehealth: Payer: Self-pay | Admitting: Student in an Organized Health Care Education/Training Program

## 2023-07-11 NOTE — Telephone Encounter (Signed)
 Lorie Gaddy called to confirm 3pm appt for today. I explained Kuper's appt was 8am. She had written down the wrong time. She is asking can they make anoather appt and she will make sure to get Sparsh here on time. Please ask Dr Marcelino. He has said do not reschedule this patient

## 2023-11-12 IMAGING — CR DG FOOT COMPLETE 3+V*R*
3 series · 3 of 3 positions shown · non-contrast
Comparison: None.

CLINICAL DATA: Right heel and ankle pain.  Fell in a hole years

EXAM:
RIGHT FOOT COMPLETE - 3+ VIEW

[foot ap]
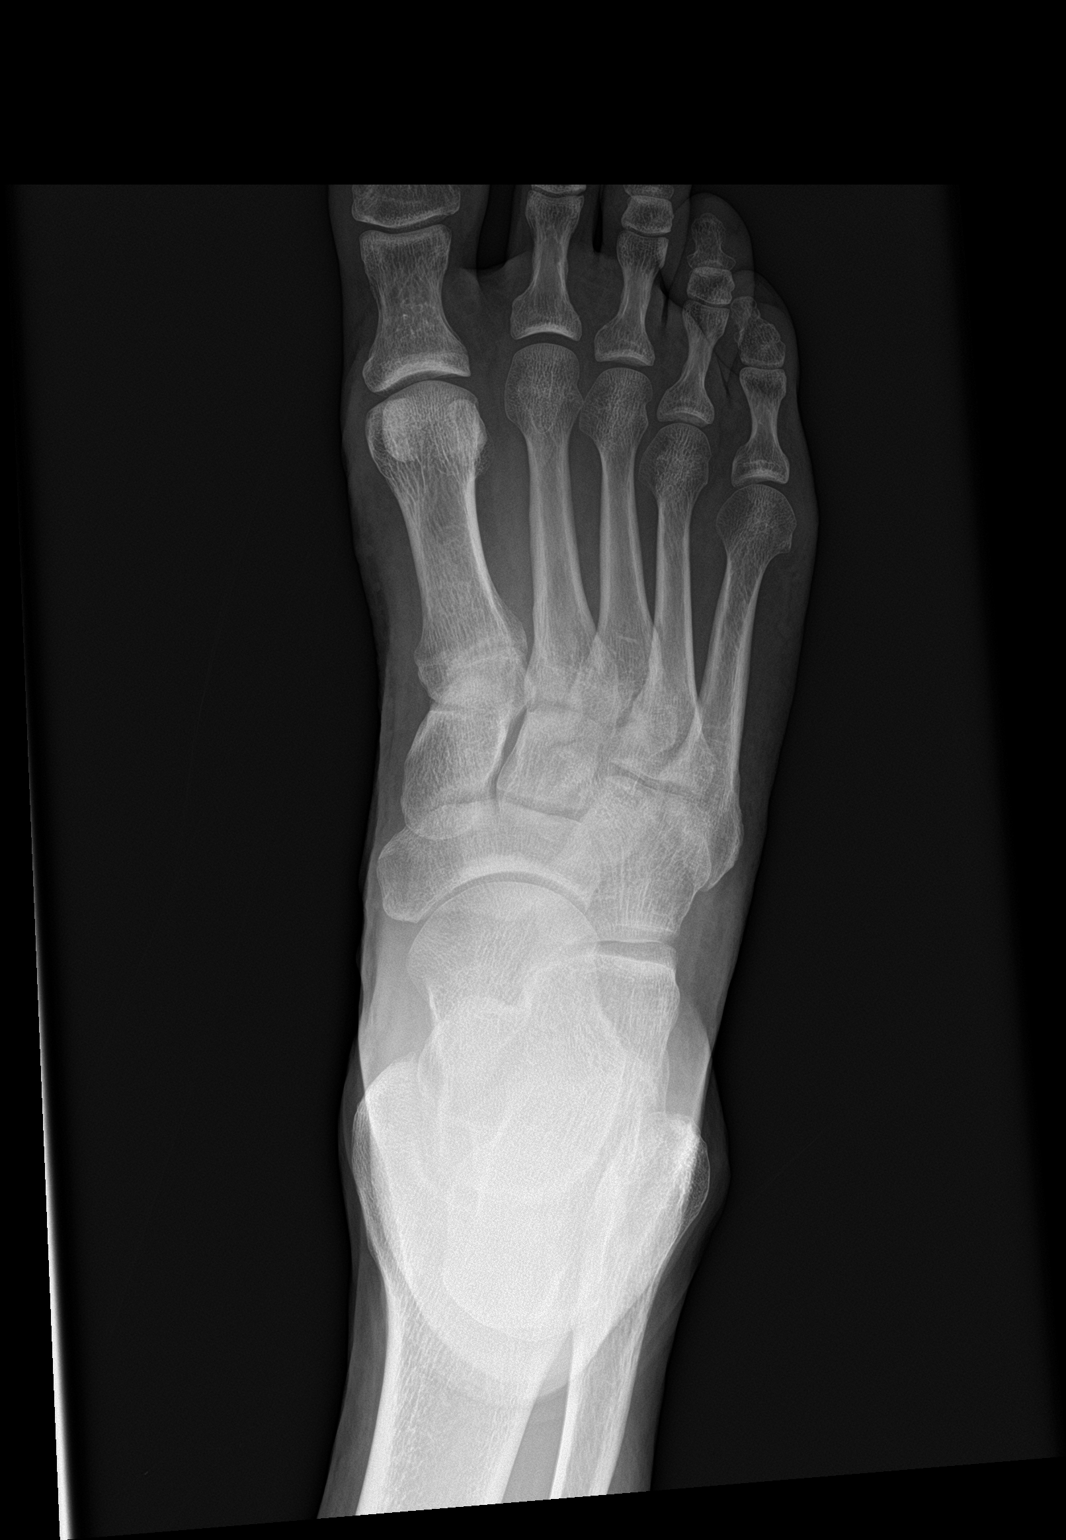

[foot obl]
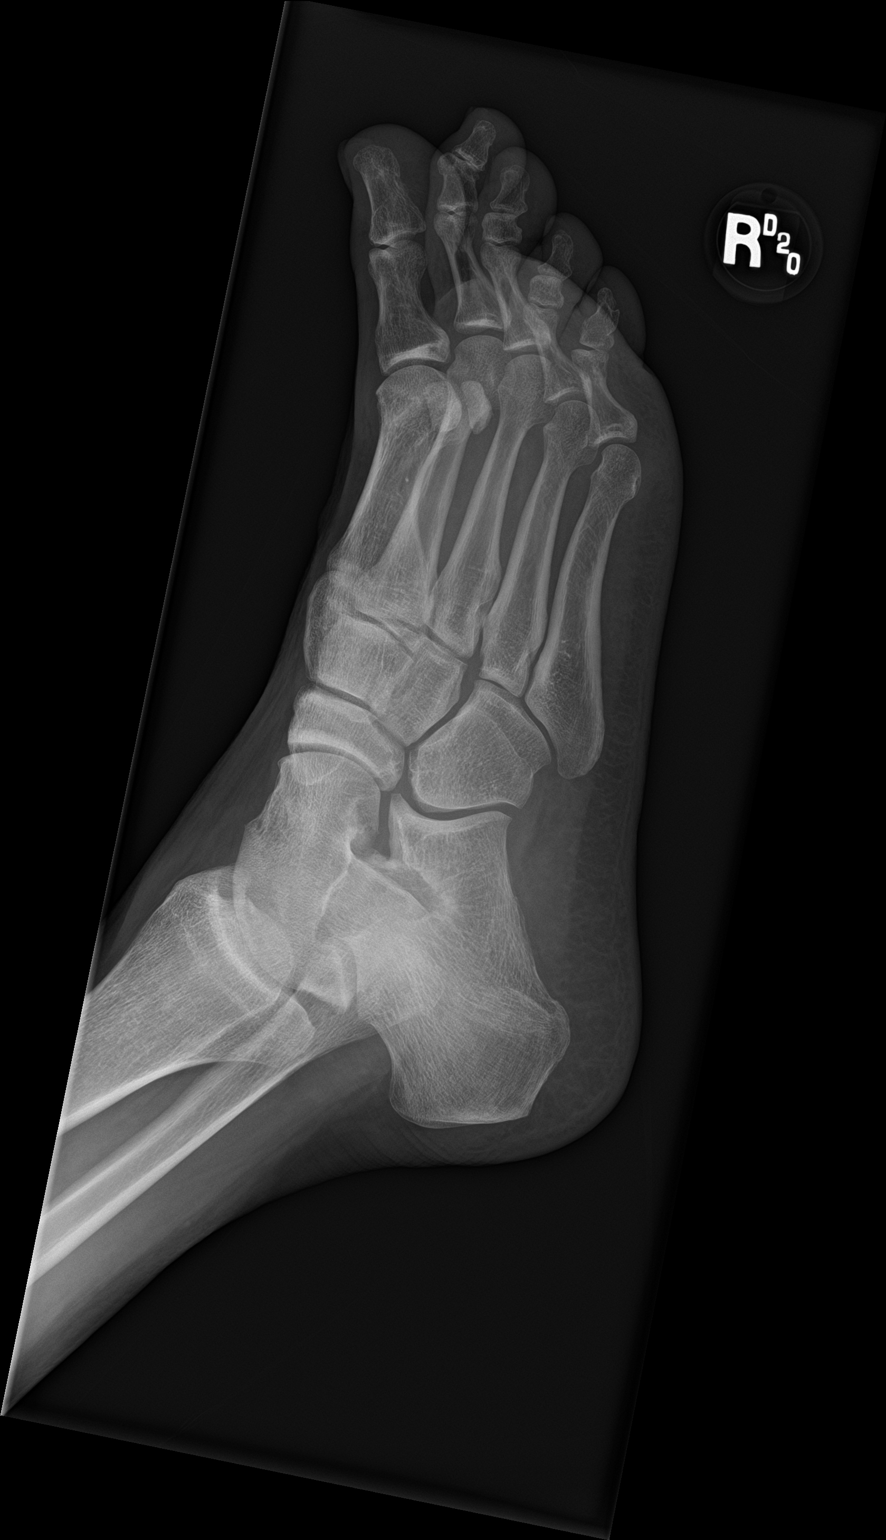

[foot lat]
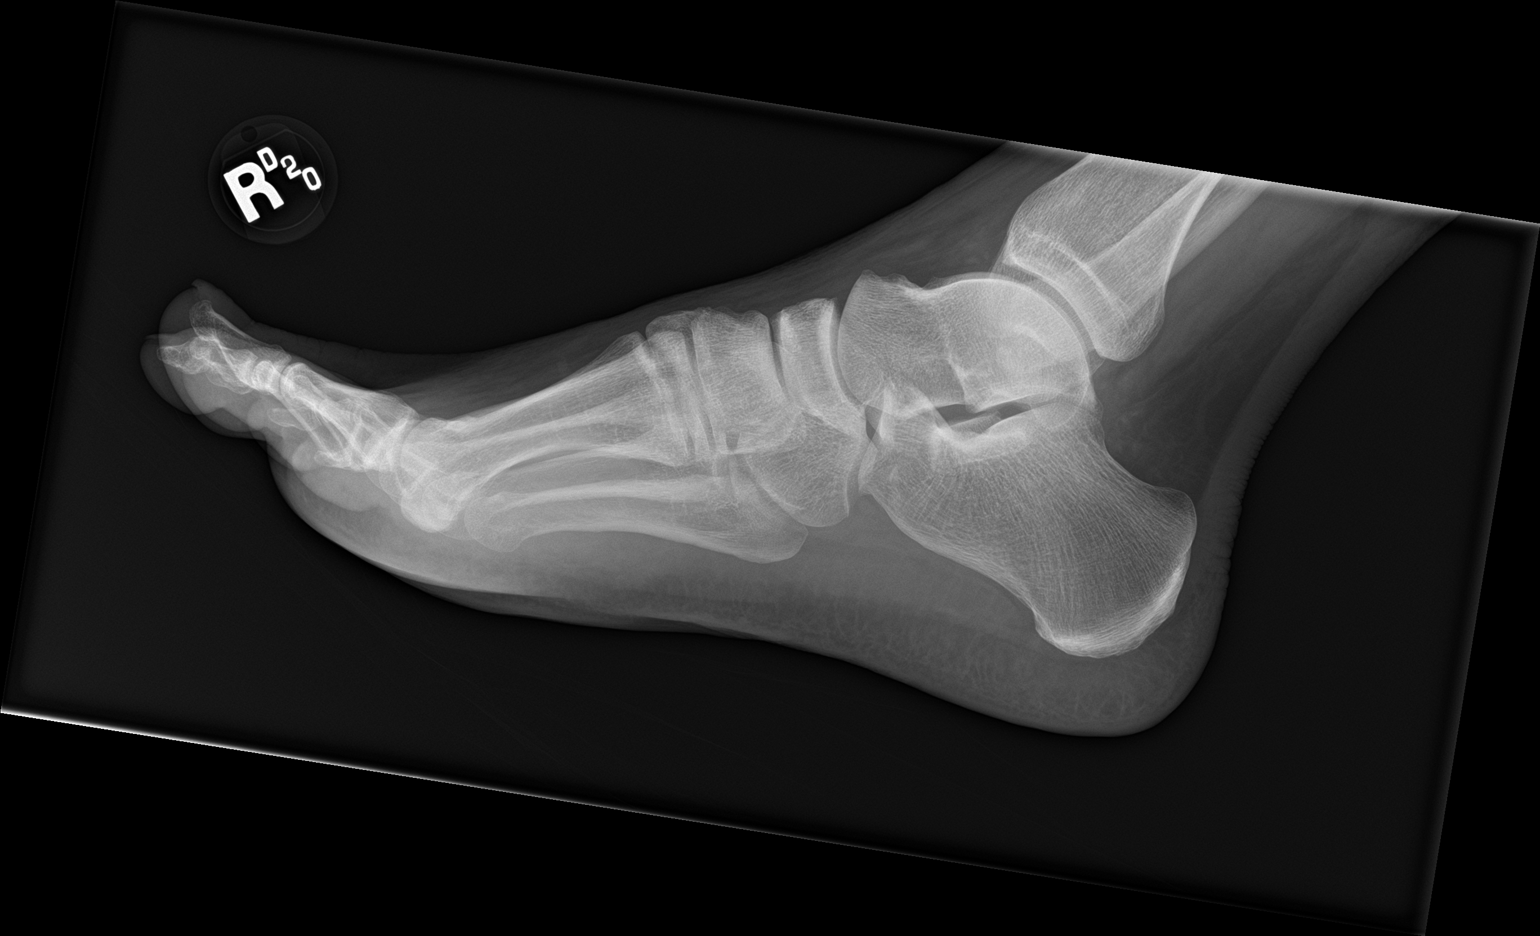

[3 of 3 positions shown; findings below may reference images not displayed]

FINDINGS: Normal bone mineralization. Joint spaces are preserved. No acute
fracture is seen. No dislocation.
IMPRESSION: Normal right foot radiographs.

## 2023-11-30 ENCOUNTER — Other Ambulatory Visit: Payer: Self-pay | Admitting: Gastroenterology

## 2023-11-30 DIAGNOSIS — G894 Chronic pain syndrome: Secondary | ICD-10-CM

## 2023-11-30 DIAGNOSIS — R1904 Left lower quadrant abdominal swelling, mass and lump: Secondary | ICD-10-CM

## 2023-11-30 DIAGNOSIS — Z720 Tobacco use: Secondary | ICD-10-CM

## 2023-11-30 DIAGNOSIS — R634 Abnormal weight loss: Secondary | ICD-10-CM

## 2023-11-30 DIAGNOSIS — K581 Irritable bowel syndrome with constipation: Secondary | ICD-10-CM

## 2023-11-30 DIAGNOSIS — R1903 Right lower quadrant abdominal swelling, mass and lump: Secondary | ICD-10-CM

## 2023-11-30 DIAGNOSIS — K625 Hemorrhage of anus and rectum: Secondary | ICD-10-CM

## 2023-11-30 DIAGNOSIS — K2960 Other gastritis without bleeding: Secondary | ICD-10-CM

## 2023-11-30 DIAGNOSIS — T39395A Adverse effect of other nonsteroidal anti-inflammatory drugs [NSAID], initial encounter: Secondary | ICD-10-CM

## 2023-12-14 ENCOUNTER — Ambulatory Visit
Admission: RE | Admit: 2023-12-14 | Discharge: 2023-12-14 | Disposition: A | Discharge status: Home / Self Care | Source: Ambulatory Visit | Attending: Gastroenterology | Admitting: Gastroenterology

## 2023-12-14 DIAGNOSIS — K2960 Other gastritis without bleeding: Secondary | ICD-10-CM | POA: Diagnosis present

## 2023-12-14 DIAGNOSIS — T39395A Adverse effect of other nonsteroidal anti-inflammatory drugs [NSAID], initial encounter: Secondary | ICD-10-CM | POA: Diagnosis present

## 2023-12-14 DIAGNOSIS — R634 Abnormal weight loss: Secondary | ICD-10-CM

## 2023-12-14 DIAGNOSIS — R1904 Left lower quadrant abdominal swelling, mass and lump: Secondary | ICD-10-CM | POA: Diagnosis present

## 2023-12-14 DIAGNOSIS — G894 Chronic pain syndrome: Secondary | ICD-10-CM

## 2023-12-14 DIAGNOSIS — K581 Irritable bowel syndrome with constipation: Secondary | ICD-10-CM | POA: Diagnosis present

## 2023-12-14 DIAGNOSIS — Z720 Tobacco use: Secondary | ICD-10-CM | POA: Diagnosis present

## 2023-12-14 DIAGNOSIS — R1903 Right lower quadrant abdominal swelling, mass and lump: Secondary | ICD-10-CM | POA: Diagnosis present

## 2023-12-14 DIAGNOSIS — K625 Hemorrhage of anus and rectum: Secondary | ICD-10-CM | POA: Diagnosis present

## 2023-12-14 MED ORDER — IOHEXOL 300 MG/ML  SOLN
100.0000 mL | Freq: Once | INTRAMUSCULAR | Status: AC | PRN
  Administered 2023-12-14: 100 mL via INTRAVENOUS

## 2024-02-02 ENCOUNTER — Ambulatory Visit: Payer: Self-pay | Admitting: General Surgery

## 2024-02-02 NOTE — Progress Notes (Signed)
 History of Present Illness Ronald Watts is a 58 year old male with hemorrhoids who presents for evaluation of worsening symptoms.  He has a long-standing history of hemorrhoids, previously treated with banding. Recently, the symptoms have worsened significantly, with prolapsing hemorrhoids requiring manual reduction. He experiences severe pain and bleeding, with bleeding episodes occurring three times yesterday and again this morning after a bowel movement.  He has a history of irritable bowel syndrome (IBS), which he associates with his current symptoms. He describes abdominal tightness and pain, likening it to 'a belt tightening' and 'a fist in my stomach.' Despite frequent bowel movements, he experiences significant discomfort and a sensation of constipation. He was previously prescribed Linzess, which he discontinued due to severe stomach pain and bleeding. His primary care physician has prescribed lactulose, and he is awaiting approval for another medication, possibly Nutralox.  He has a history of alcohol use, which he believes previously prevented constipation. He is legally blind, which exacerbates his difficulties with mobility due to the hemorrhoids.  He has undergone numerous colonoscopies and a recent CT scan, which did not reveal significant findings related to his symptoms. He is frustrated with the lack of visible findings during these procedures.      PAST MEDICAL HISTORY:  Past Medical History:  Diagnosis Date  . Barrett's esophagus 12/17/2004  . ED (erectile dysfunction)   . GERD (gastroesophageal reflux disease)   . Knee pain, right   . Sciatica   . Stroke (CMS/HHS-HCC)         PAST SURGICAL HISTORY:   Past Surgical History:  Procedure Laterality Date  . OLECRANON BURSECTOMY & ELBOW DEBRIDE Right 01/29/2018  . COLONOSCOPY  09/30/2021   Hyperplastic polyp/Repeat 41yrs/SMR  . EGD  09/30/2021   Barrett's Esophagus/Candidal Esophagitis/Repeat 41yrs/SMR  . COLONOSCOPY   10/28/2004, 09/24/2001  . EGD  12/17/2004, 09/24/2001  . VASECTOMY           MEDICATIONS:  Outpatient Encounter Medications as of 02/02/2024  Medication Sig Dispense Refill  . atorvastatin (LIPITOR) 40 MG tablet     . cyclobenzaprine  (FLEXERIL ) 10 MG tablet     . eszopiclone (LUNESTA) 3 mg tablet Take 3 mg by mouth    . ferrous sulfate  325 (65 FE) MG tablet Take 325 mg by mouth every other day    . gabapentin  (NEURONTIN ) 100 MG capsule Take by mouth    . gabapentin  (NEURONTIN ) 600 MG tablet Take by mouth    . HYDROcodone -acetaminophen  (NORCO) 5-325 mg tablet Take 1 tablet by mouth every 4 (four) hours as needed for Pain    . IBU 800 mg tablet Take 1 tablet (800 mg total) by mouth  0  . lisinopril  (PRINIVIL ,ZESTRIL ) 10 MG tablet     . lubiprostone (AMITIZA) 24 MCG capsule Take 1 capsule (24 mcg total) by mouth 2 (two) times daily with meals 60 capsule 5  . magnesium  oxide (MAG-OX) 400 mg (241.3 mg magnesium ) tablet     . pantoprazole  (PROTONIX ) 40 MG DR tablet Take 1 tablet (40 mg total) by mouth 2 (two) times daily 60 tablet 3  . sildenafiL (VIAGRA) 100 MG tablet Take 1 tablet (100 mg total) by mouth Take 100 mg by mouth daily as needed for erectile dysfunction.    . traMADoL  (ULTRAM ) 50 mg tablet     . hydroCHLOROthiazide  (HYDRODIURIL ) 12.5 MG tablet Take 12.5 mg by mouth once daily (Patient not taking: Reported on 02/02/2024)     No facility-administered encounter medications on file as of  02/02/2024.     ALLERGIES:   Patient has no known allergies.   SOCIAL HISTORY:  Social History   Socioeconomic History  . Marital status: Married  Tobacco Use  . Smoking status: Every Day  . Smokeless tobacco: Never  Vaping Use  . Vaping status: Never Used  Substance and Sexual Activity  . Alcohol use: Yes    Comment: occ  . Drug use: Never  . Sexual activity: Yes   Social Drivers of Corporate investment banker Strain: High Risk (02/02/2024)   Overall Financial Resource Strain (CARDIA)    . Difficulty of Paying Living Expenses: Hard  Food Insecurity: Food Insecurity Present (02/02/2024)   Hunger Vital Sign   . Worried About Programme researcher, broadcasting/film/video in the Last Year: Sometimes true   . Ran Out of Food in the Last Year: Sometimes true  Transportation Needs: Unmet Transportation Needs (02/02/2024)   PRAPARE - Transportation   . Lack of Transportation (Medical): Yes   . Lack of Transportation (Non-Medical): No    FAMILY HISTORY:  Family History  Problem Relation Name Age of Onset  . High blood pressure (Hypertension) Mother    . Diabetes type II Mother    . High blood pressure (Hypertension) Father    . Cancer Father       GENERAL REVIEW OF SYSTEMS:   General ROS: negative for - chills, fatigue, fever, weight gain or weight loss Allergy and Immunology ROS: negative for - hives  Hematological and Lymphatic ROS: negative for - bleeding problems or bruising, negative for palpable nodes Endocrine ROS: negative for - heat or cold intolerance, hair changes Respiratory ROS: negative for - cough, shortness of breath or wheezing Cardiovascular ROS: no chest pain or palpitations GI ROS: negative for nausea, vomiting, positive for abdominal pain, constipation Musculoskeletal ROS: negative for - joint swelling or muscle pain Neurological ROS: negative for - confusion, syncope Dermatological ROS: negative for pruritus and rash  PHYSICAL EXAM:  Vitals:   02/02/24 0940  BP: (!) 147/107  Pulse: 78  .  Ht:177.8 cm (5' 10) Wt:63.5 kg (140 lb) ADJ:Anib surface area is 1.77 meters squared. Body mass index is 20.09 kg/m.SABRA   GENERAL: Alert, active, oriented x3  HEENT: Pupils equal reactive to light. Extraocular movements are intact. Sclera clear. Palpebral conjunctiva normal red color.Pharynx clear.  NECK: Supple with no palpable mass and no adenopathy.  LUNGS: Sound clear with no rales rhonchi or wheezes.  HEART: Regular rhythm S1 and S2 without murmur.  ABDOMEN: Soft and  depressible, nontender with no palpable mass, no hepatomegaly.   RECTAL: Large internal/internal hemorrhoids, grade 3, no bleeding, pain on palpation.  No fissures.  Adequate rectal tone.  EXTREMITIES: Well-developed well-nourished symmetrical with no dependent edema.  NEUROLOGICAL: Awake alert oriented, facial expression symmetrical, moving all extremities.   Results RADIOLOGY Abdominal CT: No significant findings related to the abdomen.    Assessment & Plan Hemorrhoids   Chronic, severe hemorrhoids with significant prolapse and bleeding require surgical intervention due to size and severity. Previous banding was ineffective. Symptoms include pain, bleeding, and difficulty with bowel movements. Surgery risks include significant postoperative pain and potential recurrence if IBS and constipation are not managed. Emphasized the importance of managing IBS to prevent recurrence. He understands the painful recovery process, lasting 2-4 weeks, but acknowledges the long-term benefits of surgery. Schedule hemorrhoidectomy. Recommend taking Miralax twice daily post-surgery to maintain loose stools. Advise against holding bowel movements post-surgery to prevent increased pain and complications.  Irritable  Bowel Syndrome (IBS)   IBS symptoms were exacerbated by previous medication (Linzess). Current management with lactulose and tramadol  has improved symptoms. Constipation contributes to hemorrhoid exacerbation. Discussed the importance of managing IBS to prevent hemorrhoid recurrence post-surgery. Discuss IBS management with primary care provider and Jonette Primmer, PA to optimize treatment and prevent constipation.  Sciatica   Chronic sciatica presents pain management challenges. Current management with tramadol  has improved symptoms by 60%. Non-constipating pain management options are needed to avoid exacerbating hemorrhoids and IBS. Continue tramadol  for pain management.  Spinal Arthritis   Chronic  spinal arthritis contributes to sciatica and overall pain. Current management includes pain medication adjusted to minimize constipation.   Internal and external bleeding hemorrhoids [K64.4, K64.8]          Patient and his wife verbalized understanding, all questions were answered, and were agreeable with the plan outlined above.   Lucas Sjogren, MD  Electronically signed by Lucas Sjogren, MD

## 2024-02-02 NOTE — H&P (View-Only) (Signed)
 History of Present Illness Ronald Watts is a 58 year old male with hemorrhoids who presents for evaluation of worsening symptoms.  He has a long-standing history of hemorrhoids, previously treated with banding. Recently, the symptoms have worsened significantly, with prolapsing hemorrhoids requiring manual reduction. He experiences severe pain and bleeding, with bleeding episodes occurring three times yesterday and again this morning after a bowel movement.  He has a history of irritable bowel syndrome (IBS), which he associates with his current symptoms. He describes abdominal tightness and pain, likening it to 'a belt tightening' and 'a fist in my stomach.' Despite frequent bowel movements, he experiences significant discomfort and a sensation of constipation. He was previously prescribed Linzess, which he discontinued due to severe stomach pain and bleeding. His primary care physician has prescribed lactulose, and he is awaiting approval for another medication, possibly Nutralox.  He has a history of alcohol use, which he believes previously prevented constipation. He is legally blind, which exacerbates his difficulties with mobility due to the hemorrhoids.  He has undergone numerous colonoscopies and a recent CT scan, which did not reveal significant findings related to his symptoms. He is frustrated with the lack of visible findings during these procedures.      PAST MEDICAL HISTORY:  Past Medical History:  Diagnosis Date  . Barrett's esophagus 12/17/2004  . ED (erectile dysfunction)   . GERD (gastroesophageal reflux disease)   . Knee pain, right   . Sciatica   . Stroke (CMS/HHS-HCC)         PAST SURGICAL HISTORY:   Past Surgical History:  Procedure Laterality Date  . OLECRANON BURSECTOMY & ELBOW DEBRIDE Right 01/29/2018  . COLONOSCOPY  09/30/2021   Hyperplastic polyp/Repeat 41yrs/SMR  . EGD  09/30/2021   Barrett's Esophagus/Candidal Esophagitis/Repeat 41yrs/SMR  . COLONOSCOPY   10/28/2004, 09/24/2001  . EGD  12/17/2004, 09/24/2001  . VASECTOMY           MEDICATIONS:  Outpatient Encounter Medications as of 02/02/2024  Medication Sig Dispense Refill  . atorvastatin (LIPITOR) 40 MG tablet     . cyclobenzaprine  (FLEXERIL ) 10 MG tablet     . eszopiclone (LUNESTA) 3 mg tablet Take 3 mg by mouth    . ferrous sulfate  325 (65 FE) MG tablet Take 325 mg by mouth every other day    . gabapentin  (NEURONTIN ) 100 MG capsule Take by mouth    . gabapentin  (NEURONTIN ) 600 MG tablet Take by mouth    . HYDROcodone -acetaminophen  (NORCO) 5-325 mg tablet Take 1 tablet by mouth every 4 (four) hours as needed for Pain    . IBU 800 mg tablet Take 1 tablet (800 mg total) by mouth  0  . lisinopril  (PRINIVIL ,ZESTRIL ) 10 MG tablet     . lubiprostone (AMITIZA) 24 MCG capsule Take 1 capsule (24 mcg total) by mouth 2 (two) times daily with meals 60 capsule 5  . magnesium  oxide (MAG-OX) 400 mg (241.3 mg magnesium ) tablet     . pantoprazole  (PROTONIX ) 40 MG DR tablet Take 1 tablet (40 mg total) by mouth 2 (two) times daily 60 tablet 3  . sildenafiL (VIAGRA) 100 MG tablet Take 1 tablet (100 mg total) by mouth Take 100 mg by mouth daily as needed for erectile dysfunction.    . traMADoL  (ULTRAM ) 50 mg tablet     . hydroCHLOROthiazide  (HYDRODIURIL ) 12.5 MG tablet Take 12.5 mg by mouth once daily (Patient not taking: Reported on 02/02/2024)     No facility-administered encounter medications on file as of  02/02/2024.     ALLERGIES:   Patient has no known allergies.   SOCIAL HISTORY:  Social History   Socioeconomic History  . Marital status: Married  Tobacco Use  . Smoking status: Every Day  . Smokeless tobacco: Never  Vaping Use  . Vaping status: Never Used  Substance and Sexual Activity  . Alcohol use: Yes    Comment: occ  . Drug use: Never  . Sexual activity: Yes   Social Drivers of Corporate investment banker Strain: High Risk (02/02/2024)   Overall Financial Resource Strain (CARDIA)    . Difficulty of Paying Living Expenses: Hard  Food Insecurity: Food Insecurity Present (02/02/2024)   Hunger Vital Sign   . Worried About Programme researcher, broadcasting/film/video in the Last Year: Sometimes true   . Ran Out of Food in the Last Year: Sometimes true  Transportation Needs: Unmet Transportation Needs (02/02/2024)   PRAPARE - Transportation   . Lack of Transportation (Medical): Yes   . Lack of Transportation (Non-Medical): No    FAMILY HISTORY:  Family History  Problem Relation Name Age of Onset  . High blood pressure (Hypertension) Mother    . Diabetes type II Mother    . High blood pressure (Hypertension) Father    . Cancer Father       GENERAL REVIEW OF SYSTEMS:   General ROS: negative for - chills, fatigue, fever, weight gain or weight loss Allergy and Immunology ROS: negative for - hives  Hematological and Lymphatic ROS: negative for - bleeding problems or bruising, negative for palpable nodes Endocrine ROS: negative for - heat or cold intolerance, hair changes Respiratory ROS: negative for - cough, shortness of breath or wheezing Cardiovascular ROS: no chest pain or palpitations GI ROS: negative for nausea, vomiting, positive for abdominal pain, constipation Musculoskeletal ROS: negative for - joint swelling or muscle pain Neurological ROS: negative for - confusion, syncope Dermatological ROS: negative for pruritus and rash  PHYSICAL EXAM:  Vitals:   02/02/24 0940  BP: (!) 147/107  Pulse: 78  .  Ht:177.8 cm (5' 10) Wt:63.5 kg (140 lb) ADJ:Anib surface area is 1.77 meters squared. Body mass index is 20.09 kg/m.SABRA   GENERAL: Alert, active, oriented x3  HEENT: Pupils equal reactive to light. Extraocular movements are intact. Sclera clear. Palpebral conjunctiva normal red color.Pharynx clear.  NECK: Supple with no palpable mass and no adenopathy.  LUNGS: Sound clear with no rales rhonchi or wheezes.  HEART: Regular rhythm S1 and S2 without murmur.  ABDOMEN: Soft and  depressible, nontender with no palpable mass, no hepatomegaly.   RECTAL: Large internal/internal hemorrhoids, grade 3, no bleeding, pain on palpation.  No fissures.  Adequate rectal tone.  EXTREMITIES: Well-developed well-nourished symmetrical with no dependent edema.  NEUROLOGICAL: Awake alert oriented, facial expression symmetrical, moving all extremities.   Results RADIOLOGY Abdominal CT: No significant findings related to the abdomen.    Assessment & Plan Hemorrhoids   Chronic, severe hemorrhoids with significant prolapse and bleeding require surgical intervention due to size and severity. Previous banding was ineffective. Symptoms include pain, bleeding, and difficulty with bowel movements. Surgery risks include significant postoperative pain and potential recurrence if IBS and constipation are not managed. Emphasized the importance of managing IBS to prevent recurrence. He understands the painful recovery process, lasting 2-4 weeks, but acknowledges the long-term benefits of surgery. Schedule hemorrhoidectomy. Recommend taking Miralax twice daily post-surgery to maintain loose stools. Advise against holding bowel movements post-surgery to prevent increased pain and complications.  Irritable  Bowel Syndrome (IBS)   IBS symptoms were exacerbated by previous medication (Linzess). Current management with lactulose and tramadol  has improved symptoms. Constipation contributes to hemorrhoid exacerbation. Discussed the importance of managing IBS to prevent hemorrhoid recurrence post-surgery. Discuss IBS management with primary care provider and Jonette Primmer, PA to optimize treatment and prevent constipation.  Sciatica   Chronic sciatica presents pain management challenges. Current management with tramadol  has improved symptoms by 60%. Non-constipating pain management options are needed to avoid exacerbating hemorrhoids and IBS. Continue tramadol  for pain management.  Spinal Arthritis   Chronic  spinal arthritis contributes to sciatica and overall pain. Current management includes pain medication adjusted to minimize constipation.   Internal and external bleeding hemorrhoids [K64.4, K64.8]          Patient and his wife verbalized understanding, all questions were answered, and were agreeable with the plan outlined above.   Lucas Sjogren, MD  Electronically signed by Lucas Sjogren, MD

## 2024-02-05 ENCOUNTER — Encounter
Admission: RE | Disposition: A | Discharge status: Home / Self Care | Payer: Self-pay | Source: Home / Self Care | Attending: General Surgery

## 2024-02-05 ENCOUNTER — Ambulatory Visit: Admitting: Certified Registered"

## 2024-02-05 ENCOUNTER — Encounter: Payer: Self-pay | Admitting: General Surgery

## 2024-02-05 ENCOUNTER — Ambulatory Visit
Admission: RE | Admit: 2024-02-05 | Discharge: 2024-02-05 | Disposition: A | Discharge status: Home / Self Care | Attending: General Surgery | Admitting: General Surgery

## 2024-02-05 ENCOUNTER — Other Ambulatory Visit: Payer: Self-pay

## 2024-02-05 DIAGNOSIS — F1010 Alcohol abuse, uncomplicated: Secondary | ICD-10-CM

## 2024-02-05 DIAGNOSIS — H548 Legal blindness, as defined in USA: Secondary | ICD-10-CM | POA: Diagnosis not present

## 2024-02-05 DIAGNOSIS — M5430 Sciatica, unspecified side: Secondary | ICD-10-CM | POA: Insufficient documentation

## 2024-02-05 DIAGNOSIS — M479 Spondylosis, unspecified: Secondary | ICD-10-CM | POA: Diagnosis not present

## 2024-02-05 DIAGNOSIS — Z01812 Encounter for preprocedural laboratory examination: Secondary | ICD-10-CM

## 2024-02-05 DIAGNOSIS — K219 Gastro-esophageal reflux disease without esophagitis: Secondary | ICD-10-CM | POA: Insufficient documentation

## 2024-02-05 DIAGNOSIS — Z8673 Personal history of transient ischemic attack (TIA), and cerebral infarction without residual deficits: Secondary | ICD-10-CM | POA: Insufficient documentation

## 2024-02-05 DIAGNOSIS — K644 Residual hemorrhoidal skin tags: Secondary | ICD-10-CM | POA: Diagnosis not present

## 2024-02-05 DIAGNOSIS — E7800 Pure hypercholesterolemia, unspecified: Secondary | ICD-10-CM

## 2024-02-05 DIAGNOSIS — K642 Third degree hemorrhoids: Secondary | ICD-10-CM | POA: Insufficient documentation

## 2024-02-05 DIAGNOSIS — D649 Anemia, unspecified: Secondary | ICD-10-CM | POA: Insufficient documentation

## 2024-02-05 DIAGNOSIS — F17200 Nicotine dependence, unspecified, uncomplicated: Secondary | ICD-10-CM | POA: Insufficient documentation

## 2024-02-05 DIAGNOSIS — K589 Irritable bowel syndrome without diarrhea: Secondary | ICD-10-CM | POA: Diagnosis not present

## 2024-02-05 DIAGNOSIS — I6300 Cerebral infarction due to thrombosis of unspecified precerebral artery: Secondary | ICD-10-CM

## 2024-02-05 HISTORY — PX: HEMORRHOID SURGERY: SHX153

## 2024-02-05 LAB — BASIC METABOLIC PANEL WITH GFR
Anion gap: 11 (ref 5–15)
BUN: 10 mg/dL (ref 6–20)
CO2: 26 mmol/L (ref 22–32)
Calcium: 9.3 mg/dL (ref 8.9–10.3)
Chloride: 109 mmol/L (ref 98–111)
Creatinine, Ser: 1.1 mg/dL (ref 0.61–1.24)
GFR, Estimated: >60 mL/min (ref >60)
Glucose, Bld: 102 mg/dL — ABNORMAL HIGH (ref 70–99)
Potassium: 4.3 mmol/L (ref 3.5–5.1)
Sodium: 146 mmol/L — ABNORMAL HIGH (ref 135–145)

## 2024-02-05 SURGERY — HEMORRHOIDECTOMY
Anesthesia: General | Site: Rectum

## 2024-02-05 MED ORDER — CIPROFLOXACIN IN D5W 400 MG/200ML IV SOLN
400.0000 mg | INTRAVENOUS | Status: AC
  Administered 2024-02-05: 400 mg via INTRAVENOUS

## 2024-02-05 MED ORDER — FENTANYL CITRATE (PF) 100 MCG/2ML IJ SOLN
INTRAMUSCULAR | Status: DC | PRN
  Administered 2024-02-05 (×2): 50 ug via INTRAVENOUS

## 2024-02-05 MED ORDER — BUPIVACAINE LIPOSOME 1.3 % IJ SUSP
INTRAMUSCULAR | Status: AC
  Filled 2024-02-05: qty 20

## 2024-02-05 MED ORDER — LIDOCAINE HCL (PF) 2 % IJ SOLN
INTRAMUSCULAR | Status: AC
  Filled 2024-02-05: qty 5

## 2024-02-05 MED ORDER — DEXMEDETOMIDINE HCL IN NACL 80 MCG/20ML IV SOLN
INTRAVENOUS | Status: DC | PRN
  Administered 2024-02-05: 8 ug via INTRAVENOUS

## 2024-02-05 MED ORDER — PROPOFOL 10 MG/ML IV BOLUS
INTRAVENOUS | Status: DC | PRN
Start: 2024-02-05 — End: 2024-02-05
  Administered 2024-02-05: 160 mg via INTRAVENOUS

## 2024-02-05 MED ORDER — CHLORHEXIDINE GLUCONATE 0.12 % MT SOLN
15.0000 mL | Freq: Once | OROMUCOSAL | Status: AC
  Administered 2024-02-05: 15 mL via OROMUCOSAL

## 2024-02-05 MED ORDER — PROPOFOL 10 MG/ML IV BOLUS
INTRAVENOUS | Status: AC
  Filled 2024-02-05: qty 20

## 2024-02-05 MED ORDER — GLYCOPYRROLATE 0.2 MG/ML IJ SOLN
INTRAMUSCULAR | Status: DC | PRN
  Administered 2024-02-05: .2 mg via INTRAVENOUS

## 2024-02-05 MED ORDER — FENTANYL CITRATE (PF) 100 MCG/2ML IJ SOLN
INTRAMUSCULAR | Status: AC
  Filled 2024-02-05: qty 2

## 2024-02-05 MED ORDER — LIDOCAINE HCL (CARDIAC) PF 100 MG/5ML IV SOSY
PREFILLED_SYRINGE | INTRAVENOUS | Status: DC | PRN
  Administered 2024-02-05: 80 mg via INTRAVENOUS

## 2024-02-05 MED ORDER — METRONIDAZOLE 500 MG/100ML IV SOLN
500.0000 mg | INTRAVENOUS | Status: AC
  Administered 2024-02-05: 500 mg via INTRAVENOUS
  Filled 2024-02-05: qty 100

## 2024-02-05 MED ORDER — LACTATED RINGERS IV SOLN: INTRAVENOUS | Status: DC

## 2024-02-05 MED ORDER — GELATIN ABSORBABLE 100 EX MISC
CUTANEOUS | Status: DC | PRN
  Administered 2024-02-05: 1

## 2024-02-05 MED ORDER — 0.9 % SODIUM CHLORIDE (POUR BTL) OPTIME
TOPICAL | Status: DC | PRN
  Administered 2024-02-05: 250 mL

## 2024-02-05 MED ORDER — CIPROFLOXACIN IN D5W 400 MG/200ML IV SOLN
INTRAVENOUS | Status: AC
  Filled 2024-02-05: qty 200

## 2024-02-05 MED ORDER — PHENYLEPHRINE 80 MCG/ML (10ML) SYRINGE FOR IV PUSH (FOR BLOOD PRESSURE SUPPORT)
PREFILLED_SYRINGE | INTRAVENOUS | Status: AC
  Filled 2024-02-05: qty 10

## 2024-02-05 MED ORDER — ONDANSETRON HCL 4 MG/2ML IJ SOLN
INTRAMUSCULAR | Status: DC | PRN
  Administered 2024-02-05: 4 mg via INTRAVENOUS

## 2024-02-05 MED ORDER — BUPIVACAINE-EPINEPHRINE (PF) 0.5% -1:200000 IJ SOLN
INTRAMUSCULAR | Status: AC
  Filled 2024-02-05: qty 10

## 2024-02-05 MED ORDER — GLYCOPYRROLATE 0.2 MG/ML IJ SOLN
INTRAMUSCULAR | Status: AC
  Filled 2024-02-05: qty 1

## 2024-02-05 MED ORDER — GELATIN ABSORBABLE 12-7 MM EX MISC
CUTANEOUS | Status: AC
Start: 2024-02-05 — End: 2024-02-05
  Filled 2024-02-05: qty 1

## 2024-02-05 MED ORDER — DEXAMETHASONE SODIUM PHOSPHATE 10 MG/ML IJ SOLN
INTRAMUSCULAR | Status: DC | PRN
  Administered 2024-02-05: 4 mg via INTRAVENOUS

## 2024-02-05 MED ORDER — DEXAMETHASONE SODIUM PHOSPHATE 10 MG/ML IJ SOLN
INTRAMUSCULAR | Status: AC
  Filled 2024-02-05: qty 1

## 2024-02-05 MED ORDER — MIDAZOLAM HCL 2 MG/2ML IJ SOLN
INTRAMUSCULAR | Status: DC | PRN
  Administered 2024-02-05: 2 mg via INTRAVENOUS

## 2024-02-05 MED ORDER — MIDAZOLAM HCL 2 MG/2ML IJ SOLN
INTRAMUSCULAR | Status: AC
  Filled 2024-02-05: qty 2

## 2024-02-05 MED ORDER — LACTATED RINGERS IV SOLN
INTRAVENOUS | Status: DC | PRN
Start: 2024-02-05 — End: 2024-02-05

## 2024-02-05 MED ORDER — ONDANSETRON HCL 4 MG/2ML IJ SOLN
INTRAMUSCULAR | Status: AC
  Filled 2024-02-05: qty 2

## 2024-02-05 MED ORDER — BUPIVACAINE-EPINEPHRINE 0.5% -1:200000 IJ SOLN
INTRAMUSCULAR | Status: DC | PRN
  Administered 2024-02-05: 50 mL

## 2024-02-05 MED ORDER — CHLORHEXIDINE GLUCONATE 0.12 % MT SOLN
OROMUCOSAL | Status: AC
  Filled 2024-02-05: qty 15

## 2024-02-05 MED ORDER — ORAL CARE MOUTH RINSE: 15.0000 mL | Freq: Once | OROMUCOSAL | Status: AC

## 2024-02-05 MED ORDER — DEXMEDETOMIDINE HCL IN NACL 80 MCG/20ML IV SOLN
INTRAVENOUS | Status: AC
Start: 2024-02-05 — End: 2024-02-05
  Filled 2024-02-05: qty 20

## 2024-02-05 SURGICAL SUPPLY — 30 items
BRIEF MESH DISP 2XL (UNDERPADS AND DIAPERS) ×1 IMPLANT
DISSECTOR SURG LIGASURE 21 (MISCELLANEOUS) IMPLANT
DRAPE LAPAROTOMY 100X77 ABD (DRAPES) ×1 IMPLANT
DRAPE LEGGINS SURG 28X43 STRL (DRAPES) ×1 IMPLANT
DRAPE UNDER BUTTOCK W/FLU (DRAPES) ×1 IMPLANT
ELECTRODE REM PT RTRN 9FT ADLT (ELECTROSURGICAL) ×1 IMPLANT
GAUZE 4X4 16PLY ~~LOC~~+RFID DBL (SPONGE) IMPLANT
GAUZE SPONGE 4X4 12PLY STRL (GAUZE/BANDAGES/DRESSINGS) ×1 IMPLANT
GLOVE BIO SURGEON STRL SZ 6.5 (GLOVE) ×1 IMPLANT
GLOVE BIOGEL PI IND STRL 6.5 (GLOVE) ×1 IMPLANT
GLOVE SURG SYN 6.5 PF PI (GLOVE) ×2 IMPLANT
GOWN STRL REUS W/ TWL LRG LVL3 (GOWN DISPOSABLE) ×3 IMPLANT
KIT TURNOVER CYSTO (KITS) ×1 IMPLANT
LABEL OR SOLS (LABEL) ×1 IMPLANT
MANIFOLD NEPTUNE II (INSTRUMENTS) ×1 IMPLANT
NDL HYPO 22X1.5 SAFETY MO (MISCELLANEOUS) ×1 IMPLANT
NEEDLE HYPO 22X1.5 SAFETY MO (MISCELLANEOUS) ×1 IMPLANT
NS IRRIG 500ML POUR BTL (IV SOLUTION) ×1 IMPLANT
PACK BASIN MINOR ARMC (MISCELLANEOUS) ×1 IMPLANT
PAD ABD DERMACEA PRESS 5X9 (GAUZE/BANDAGES/DRESSINGS) ×1 IMPLANT
PAD PREP OB/GYN DISP 24X41 (PERSONAL CARE ITEMS) ×1 IMPLANT
SOLUTION PREP PVP 2OZ (MISCELLANEOUS) ×1 IMPLANT
SURGILUBE 2OZ TUBE FLIPTOP (MISCELLANEOUS) ×1 IMPLANT
SUT VIC AB 2-0 SH 27XBRD (SUTURE) ×1 IMPLANT
SUT VIC AB 3-0 SH 27X BRD (SUTURE) ×1 IMPLANT
SUTURE EHLN 3-0 FS-10 30 BLK (SUTURE) IMPLANT
SYR 10ML LL (SYRINGE) ×1 IMPLANT
SYR BULB IRRIG 60ML STRL (SYRINGE) ×1 IMPLANT
TRAP FLUID SMOKE EVACUATOR (MISCELLANEOUS) ×1 IMPLANT
WATER STERILE IRR 500ML POUR (IV SOLUTION) ×1 IMPLANT

## 2024-02-05 NOTE — Anesthesia Preprocedure Evaluation (Signed)
 Anesthesia Evaluation  Patient identified by MRN, date of birth, ID band Patient awake    Reviewed: Allergy & Precautions, H&P , NPO status , Patient's Chart, lab work & pertinent test results, reviewed documented beta blocker date and time   Airway Mallampati: II  TM Distance: >3 FB Neck ROM: full    Dental  (+) Poor Dentition, Chipped, Missing   Pulmonary COPD,  COPD inhaler, Current Smoker   Pulmonary exam normal        Cardiovascular Exercise Tolerance: Poor hypertension, On Medications negative cardio ROS Normal cardiovascular exam Rate:Normal     Neuro/Psych  PSYCHIATRIC DISORDERS       Neuromuscular disease CVA    GI/Hepatic ,GERD  Medicated,,(+) Hepatitis -  Endo/Other  negative endocrine ROS    Renal/GU negative Renal ROS  negative genitourinary   Musculoskeletal   Abdominal   Peds  Hematology  (+) Blood dyscrasia, anemia   Anesthesia Other Findings   Reproductive/Obstetrics negative OB ROS                              Anesthesia Physical Anesthesia Plan  ASA: 3  Anesthesia Plan: General LMA   Post-op Pain Management:    Induction:   PONV Risk Score and Plan:   Airway Management Planned:   Additional Equipment:   Intra-op Plan:   Post-operative Plan:   Informed Consent: I have reviewed the patients History and Physical, chart, labs and discussed the procedure including the risks, benefits and alternatives for the proposed anesthesia with the patient or authorized representative who has indicated his/her understanding and acceptance.       Plan Discussed with: CRNA  Anesthesia Plan Comments:         Anesthesia Quick Evaluation

## 2024-02-05 NOTE — Op Note (Signed)
 Preoperative diagnosis: Third degree hemorrhoids with bleeding.   Postoperative diagnosis: Third degree hemorrhoids with bleeding  Procedure: Anoscopy, hemorrhoidectomy.  Surgeon: Dr. Rodolph  Anesthesia: General   Wound classification: Clean Contaminated  Indications: Patient is a 58 y.o. male was found to have symptomatic hemorrhoids refractory to medical managemen.   Findings: 1. Third degree hemorrhoids with bleeding 2. Internal and external anal sphincter identified and preserved 3. Adequate hemostasis  Description of procedure: The patient was brought to the operating room and spinal anesthesia was induced. Patient was placed in the prone jackknife position. A time-out was completed verifying correct patient, procedure, site, positioning, and implant(s) and/or special equipment prior to beginning this procedure. The buttocks were taped apart.  The perineum was prepped and draped in standard sterile fashion. Local anesthetic was injected as a perianal block. An anoscope was introduced and the three hemorrhoidal pedicles were identified. A Kelly clamp was placed near the base of each pedicle near the dentate line and retracted externally to exteriorize the hemorrhoidal pedicles.  Each pedicle was excised in turn in the following fashion. An elliptical incision was made extending from perianal skin to anorectal ring including both internal and external hemorrhoids and excising a minimum amount of anoderm. Flaps were developed on both aspects of the incision, taking care to elevate only skin and mucosa. The dilated venous mass was dissected using Metzenbaum scissors from the underlying sphincter muscle. The base was ligated with a 2-0 Vicryl figure of 8 suture. The pedicle was amputated from the base. Hemostasis was achieved using electrocautery. Following hemostasis, the skin and mucosal incisions were closed with a running lock stitch of 2-0 Vicryl on the mucosal aspect and converted to  subcuticular once skin was encountered. The anal canal was then injected with local anesthetic. A gauze pad was tucked between the gluteal folds.  The patient tolerated the procedure well and was taken to the postanesthesia care unit in stable condition.   Specimen: hemorrhoids  Complications: none  EBL: 10 mL

## 2024-02-05 NOTE — Anesthesia Postprocedure Evaluation (Signed)
 Anesthesia Post Note  Patient: Ronald Watts  Procedure(s) Performed: HEMORRHOIDECTOMY (Rectum)  Patient location during evaluation: PACU Anesthesia Type: General Level of consciousness: awake and alert Pain management: pain level controlled Vital Signs Assessment: post-procedure vital signs reviewed and stable Respiratory status: spontaneous breathing, nonlabored ventilation, respiratory function stable and patient connected to nasal cannula oxygen Cardiovascular status: blood pressure returned to baseline and stable Postop Assessment: no apparent nausea or vomiting Anesthetic complications: no   No notable events documented.   Last Vitals:  Vitals:   02/05/24 1300 02/05/24 1301  BP: (!) 160/122 (!) 150/100  Pulse: 76 65  Resp: (!) 8 17  Temp:    SpO2: 99% 100%    Last Pain:  Vitals:   02/05/24 1301  TempSrc:   PainSc: 0-No pain                 Lynwood KANDICE Clause

## 2024-02-05 NOTE — Interval H&P Note (Signed)
 History and Physical Interval Note:  02/05/2024 10:42 AM  Ronald Watts  has presented today for surgery, with the diagnosis of K64.4 K64.8 Internal and external bleeding hemorrhoids.  The various methods of treatment have been discussed with the patient and family. After consideration of risks, benefits and other options for treatment, the patient has consented to  Procedure(s): HEMORRHOIDECTOMY (N/A) as a surgical intervention.  The patient's history has been reviewed, patient examined, no change in status, stable for surgery.  I have reviewed the patient's chart and labs.  Questions were answered to the patient's satisfaction.     Lucas Sjogren

## 2024-02-05 NOTE — Anesthesia Procedure Notes (Signed)
 Procedure Name: LMA Insertion Date/Time: 02/05/2024 11:06 AM  Performed by: Rosine Shona Jansky, CRNAPre-anesthesia Checklist: Patient identified, Emergency Drugs available, Suction available, Patient being monitored and Timeout performed Patient Re-evaluated:Patient Re-evaluated prior to induction Oxygen Delivery Method: Circle system utilized Preoxygenation: Pre-oxygenation with 100% oxygen Induction Type: IV induction LMA: LMA inserted LMA Size: 4.0 Number of attempts: 1 Placement Confirmation: positive ETCO2 and CO2 detector Tube secured with: Tape

## 2024-02-05 NOTE — Discharge Instructions (Addendum)
  Diet: Resume home heart healthy regular diet.   Activity: No heavy lifting >20 pounds (children, pets, laundry, garbage) or strenuous activity until follow-up, but light activity and walking are encouraged. Do not drive or drink alcohol if taking narcotic pain medications.  Wound care: May shower with soapy water and pat dry (do not rub incisions), but no baths or submerging incision underwater until follow-up. (no swimming)   Medications: Resume all home medications.   Call office ([phone]) at any time if any questions, worsening pain, fevers/chills, bleeding, drainage from incision site, or other concerns.  

## 2024-02-05 NOTE — Transfer of Care (Signed)
 Immediate Anesthesia Transfer of Care Note  Patient: Ronald Watts  Procedure(s) Performed: HEMORRHOIDECTOMY (Rectum)  Patient Location: PACU  Anesthesia Type:General  Level of Consciousness: drowsy  Airway & Oxygen Therapy: Patient Spontanous Breathing and Patient connected to face mask oxygen  Post-op Assessment: Report given to RN  Post vital signs: stable  Last Vitals:  Vitals Value Taken Time  BP 156/105 02/05/24 11:54  Temp    Pulse 79 02/05/24 11:56  Resp 14 02/05/24 11:56  SpO2 100 % 02/05/24 11:56  Vitals shown include unfiled device data.  Last Pain:  Vitals:   02/05/24 1026  TempSrc: Temporal  PainSc: 10-Worst pain ever         Complications: No notable events documented.

## 2024-02-06 ENCOUNTER — Encounter: Payer: Self-pay | Admitting: General Surgery

## 2024-02-06 LAB — SURGICAL PATHOLOGY

## 2024-04-23 ENCOUNTER — Encounter: Payer: Self-pay | Admitting: Nurse Practitioner

## 2024-04-23 ENCOUNTER — Ambulatory Visit: Attending: Student in an Organized Health Care Education/Training Program | Admitting: Nurse Practitioner

## 2024-04-23 VITALS — BP 122/90 | HR 70 | Temp 97.9°F | Resp 18 | Ht 70.0 in | Wt 139.0 lb

## 2024-04-23 DIAGNOSIS — M25552 Pain in left hip: Secondary | ICD-10-CM | POA: Diagnosis present

## 2024-04-23 DIAGNOSIS — G8929 Other chronic pain: Secondary | ICD-10-CM | POA: Insufficient documentation

## 2024-04-23 DIAGNOSIS — M25561 Pain in right knee: Secondary | ICD-10-CM | POA: Insufficient documentation

## 2024-04-23 DIAGNOSIS — M25551 Pain in right hip: Secondary | ICD-10-CM | POA: Diagnosis present

## 2024-04-23 DIAGNOSIS — M47816 Spondylosis without myelopathy or radiculopathy, lumbar region: Secondary | ICD-10-CM | POA: Diagnosis present

## 2024-04-23 DIAGNOSIS — M48061 Spinal stenosis, lumbar region without neurogenic claudication: Secondary | ICD-10-CM | POA: Diagnosis present

## 2024-04-23 DIAGNOSIS — Z0289 Encounter for other administrative examinations: Secondary | ICD-10-CM | POA: Diagnosis present

## 2024-04-23 DIAGNOSIS — M5416 Radiculopathy, lumbar region: Secondary | ICD-10-CM | POA: Insufficient documentation

## 2024-04-23 DIAGNOSIS — M4726 Other spondylosis with radiculopathy, lumbar region: Secondary | ICD-10-CM | POA: Diagnosis not present

## 2024-04-23 DIAGNOSIS — G894 Chronic pain syndrome: Secondary | ICD-10-CM | POA: Insufficient documentation

## 2024-04-23 DIAGNOSIS — Z7189 Other specified counseling: Secondary | ICD-10-CM | POA: Insufficient documentation

## 2024-04-23 MED ORDER — HYDROCODONE-ACETAMINOPHEN 5-325 MG PO TABS: 1.0000 | ORAL_TABLET | Freq: Three times a day (TID) | ORAL | 0 refills | Status: AC | PRN

## 2024-04-23 NOTE — Progress Notes (Signed)
 Safety precautions to be maintained throughout the outpatient stay will include: orient to surroundings, keep bed in low position, maintain call bell within reach at all times, provide assistance with transfer out of bed and ambulation.

## 2024-04-23 NOTE — Progress Notes (Signed)
 PROVIDER NOTE: Interpretation of information contained herein should be left to medically-trained personnel. Specific patient instructions are provided elsewhere under Patient Instructions section of medical record. This document was created in part using AI and STT-dictation technology, any transcriptional errors that may result from this process are unintentional.  Patient: Ronald Watts  Service: E/M   PCP: Buren Rock HERO, MD  DOB: 18-Jan-1966  DOS: 04/23/2024  Provider: Emmy MARLA Blanch, NP  MRN: [id number]  Delivery: Face-to-face  Specialty: Interventional Pain Management  Type: Established Patient  Setting: Ambulatory outpatient facility  Specialty designation: 09  Referring Prov.: Buren Rock HERO, MD  Location: Outpatient office facility       History of present illness (HPI) Ronald Watts, a 58 y.o. year old male, is here today because of his Chronic radicular lumbar pain [M54.16, G89.29]. Ronald Watts's primary complain today is Spine Pain , Thumb pain , and Knee Pain  Pertinent problems: 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 Location: Back Other (Comment)/Radiates all over body. Onset: More than a month ago. Quality: Aching, Constant, Stabbing, Burning. Timing: Constant. Modifying factor(s): Medication. Vitals:  height is 5' 10 (1.778 m) and weight is 139 lb (63 kg). His temporal temperature is 97.9 F (36.6 C). His blood pressure is 122/90 (abnormal) and his pulse is 70. His respiration is 18 and oxygen saturation is 100%.  BMI: Estimated body mass index is  19.94 kg/m as calculated from the following:   Height as of this encounter: 5' 10 (1.778 m).   Weight as of this encounter: 139 lb (63 kg).  Last encounter: Visit date not found. Last procedure: Visit date not found.  Reason for encounter: evaluation of worsening, or previously known (established) problem and medication management. No change in medical history since last visit.  Patient's pain is at baseline.  Patient continues multimodal pain regimen as prescribed.  States that it provides pain relief and improvement in functional status.  The patient continues experiencing low back pain radiating down to the hips and the posterior aspect of the knee.  He also reports ongoing right knee pain.  Of note, he received a transforaminal epidural steroid injection on Nov 13, 2022, which did not provide significant pain relief.  He present today for medication management.  I clearly explained the urine drug screening and pain contract.  Lumbar MRI, bilateral hip x-rays and a right knee x-ray were ordered for further evaluation for interventional therapy.  Discussed the use of AI scribe software for clinical note transcription with the patient, who gave verbal consent to proceed.  History of Present Illness   Ronald Watts is a 58 year old male who presents with chronic pain management issues. He was referred by his primary care physician and gastroenterologist for pain management.  He experiences chronic pain primarily in his lower back, hips, buttocks, and thighs, with radiation from his back down to his thighs and buttocks. He also has pain in his right knee, ongoing for approximately 20 years, and in his thumb and finger joints. He is frustrated with his current pain management, emphasizing the need for effective pain control and clarifying  that he does not seek medication for recreational use.  He received a transferaminal epidural injection in May 2024, which initially provided relief, but the pain  returned by July 2024. Since then, he has been prescribed tramadol , which caused IBS, followed by hydrocodone , and then oxycodone  post-hemorrhoid surgery. Currently, he is not on any pain medication, and over-the-counter medications like ibuprofen  and Tylenol  are ineffective.  He reports that his gastroenterologist told him that an MRI in 2022 showed issues with his back. He has not had recent imaging for his lumbar spine or hips since then. His right knee was last imaged approximately a year ago, revealing a torn ACL.  No rheumatoid arthritis, but he reports joint pain in his thumb and fingers.     Pharmacotherapy Assessment   Norco 5-325 mg 3 times daily as needed for pain. Monitoring: [place] PMP: PDMP reviewed during this encounter.       Pharmacotherapy: No side-effects or adverse reactions reported. Compliance: No problems identified. Effectiveness: Clinically acceptable.  Erlene Doyal SAUNDERS, [place]  04/23/2024  3:48 PM  Sign when Signing Visit Safety precautions to be maintained throughout the outpatient stay will include: orient to surroundings, keep bed in low position, maintain call bell within reach at all times, provide assistance with transfer out of bed and ambulation.     UDS:  No results found for: SUMMARY  No results found for: CBDTHCR No results found for: D8THCCBX No results found for: D9THCCBX  ROS  Constitutional: Denies any fever or chills Gastrointestinal: No reported hemesis, hematochezia, vomiting, or acute GI distress Musculoskeletal: Low back pain radiating to bilateral hips, bilateral hip pain, right knee pain Neurological: No reported episodes of acute onset apraxia, aphasia, dysarthria, agnosia, amnesia, paralysis, loss of coordination, or loss of consciousness  Medication Review  Eszopiclone, HYDROcodone -acetaminophen , atorvastatin, cyclobenzaprine , ferrous sulfate , gabapentin , hydrochlorothiazide , ibuprofen , lisinopril , magnesium  oxide, omeprazole,  ondansetron , pantoprazole , polyethylene glycol powder, sildenafil, and traMADol   History Review  Allergy: Ronald Watts has no known allergies. Drug: Ronald Watts  reports current drug use. Drug: Marijuana. Alcohol:  reports current alcohol use of about 6.0 standard drinks of alcohol per week. Tobacco:  reports that he has been smoking cigarettes. He has a 30 pack-year smoking history. He has never used smokeless tobacco. Social: Ronald Watts  reports that he has been smoking cigarettes. He has a 30 pack-year smoking history. He has never used smokeless tobacco. He reports current alcohol use of about 6.0 standard drinks of alcohol per week. He reports current drug use. Drug: Marijuana. Medical:  has a past medical history of Alcohol-induced acute pancreatitis, Anemia, Barrett's syndrome, COPD (chronic obstructive pulmonary disease) (HCC), ED (erectile dysfunction), GERD (gastroesophageal reflux disease), Hepatitis, History of blood transfusion, HLD (hyperlipidemia), Hypertension, Knee pain, right, Sciatica, and Stroke (HCC). Surgical: Ronald Watts  has a past surgical history that includes Colonoscopy; Upper gi endoscopy; Cyst excision; Olecranon bursectomy (Right, 01/29/2018); Hemorrhoid banding; Colonoscopy with propofol  (N/A, 09/30/2021); Esophagogastroduodenoscopy (egd) with propofol  (N/A, 09/30/2021); and Hemorrhoid surgery (N/A, 02/05/2024). Family: family history includes Cancer in his father; Diabetes in his mother; Hypertension in his father and mother.  Laboratory Chemistry Profile   Renal Lab Results  Component Value Date   BUN 10 02/05/2024   CREATININE 1.10 02/05/2024   GFRNONAA >60 02/05/2024    Hepatic Lab Results  Component Value Date   AST 33 05/09/2021   ALT 38 05/09/2021   ALBUMIN 3.3 (L) 05/09/2021   ALKPHOS 83 05/09/2021   HCVAB NON REACTIVE 05/03/2021   LIPASE 65 (  H) 05/09/2021   AMMONIA 18 05/03/2021    Electrolytes Lab Results  Component Value Date   NA 146 (H) 02/05/2024   K  4.3 02/05/2024   CL 109 02/05/2024   CALCIUM 9.3 02/05/2024   MG 1.9 05/09/2021   PHOS 4.3 05/09/2021    Bone No results found for: VD25OH, VD125OH2TOT, CI6874NY7, CI7874NY7, 25OHVITD1, 25OHVITD2, 25OHVITD3, TESTOFREE, TESTOSTERONE  Inflammation (CRP: Acute Phase) (ESR: Chronic Phase) No results found for: CRP, ESRSEDRATE, LATICACIDVEN       Note: Above Lab results reviewed.  Recent Imaging Review  CT CHEST ABDOMEN PELVIS W CONTRAST CLINICAL DATA:  Lower abdominal pain, 20 pound weight loss, anorexia, smoker * Tracking Code: BO *  EXAM: CT CHEST, ABDOMEN, AND PELVIS WITH CONTRAST  TECHNIQUE: Multidetector CT imaging of the chest, abdomen and pelvis was performed following the standard protocol during bolus administration of intravenous contrast.  RADIATION DOSE REDUCTION: This exam was performed according to the departmental dose-optimization program which includes automated exposure control, adjustment of the mA and/or kV according to patient size and/or use of iterative reconstruction technique.  CONTRAST:  OMNIPAQUE  IOHEXOL  300 MG/ML  SOLN  COMPARISON:  None Available.  FINDINGS: CT CHEST FINDINGS  Cardiovascular: Aortic atherosclerosis. Normal heart size. Three-vessel coronary artery calcifications. No pericardial effusion.  Mediastinum/Nodes: No enlarged mediastinal, hilar, or axillary lymph nodes. Thyroid gland, trachea, and esophagus demonstrate no significant findings.  Lungs/Pleura: Mild centrilobular and paraseptal emphysema. Diffuse bilateral bronchial wall thickening. Pulmonary hyperinflation. Biapical pleural-parenchymal scarring. Multiple scattered small bilateral pulmonary nodules measuring up to 0.5 cm in the peripheral left lower lobe (series 4, image 130). No pleural effusion or pneumothorax.  Musculoskeletal: No chest wall abnormality. No acute osseous findings.  CT ABDOMEN PELVIS FINDINGS  Hepatobiliary: No  solid liver abnormality is seen. No gallstones, gallbladder wall thickening, or biliary dilatation.  Pancreas: Incidental note of partial pancreas divisum (series 2, image 60). No pancreatic ductal dilatation or surrounding inflammatory changes.  Spleen: Normal in size without significant abnormality.  Adrenals/Urinary Tract: Adrenal glands are unremarkable. Punctuate nonobstructive calculus of the inferior pole of the left kidney. No right-sided calculi, ureteral calculi, or hydronephrosis. Diffuse urinary bladder wall thickening, likely secondary to chronic outlet obstruction.  Stomach/Bowel: Stomach is within normal limits. Appendix appears normal. No evidence of bowel wall thickening, distention, or inflammatory changes.  Vascular/Lymphatic: Severe aortic atherosclerosis. No enlarged abdominal or pelvic lymph nodes.  Reproductive: Mild prostatomegaly.  Other: No abdominal wall hernia or abnormality. No ascites.  Musculoskeletal: No acute osseous findings.  IMPRESSION: 1. No CT findings of the chest, abdomen, or pelvis to explain lower abdominal pain or weight loss. 2. Multiple scattered small bilateral pulmonary nodules measuring up to 0.5 cm in the peripheral left lower lobe. No follow-up needed if patient is low-risk (and has no known or suspected primary neoplasm). Non-contrast chest CT can be considered in 12 months if patient is high-risk. This recommendation follows the consensus statement: Guidelines for Management of Incidental Pulmonary Nodules Detected on CT Images: From the Fleischner Society 2017; Radiology 2017; 284:228-243. 3. Emphysema diffuse bilateral bronchial wall thickening, and pulmonary hyperinflation. 4. Punctuate nonobstructive left nephrolithiasis. 5. Mild prostatomegaly. 6. Coronary artery disease.  Aortic Atherosclerosis (ICD10-I70.0) and Emphysema (ICD10-J43.9).  Electronically Signed   By: Marolyn JONETTA Jaksch M.D.   On: 12/14/2023  13:51 Note: Reviewed        Physical Exam  Vitals: BP (!) 122/90 (BP Location: Right Arm, Patient Position: Sitting, Cuff Size: Normal)   Pulse 70   Temp  97.9 F (36.6 C) (Temporal)   Resp 18   Ht 5' 10 (1.778 m)   Wt 139 lb (63 kg)   SpO2 100%   BMI 19.94 kg/m  BMI: Estimated body mass index is 19.94 kg/m as calculated from the following:   Height as of this encounter: 5' 10 (1.778 m).   Weight as of this encounter: 139 lb (63 kg). Ideal: Ideal body weight: 73 kg (160 lb 15 oz) General appearance: Well nourished, well developed, and well hydrated. In no apparent acute distress Mental status: Alert, oriented x 3 (person, place, & time)       Respiratory: No evidence of acute respiratory distress Eyes: PERLA  Musculoskeletal: +LBP Right knee pain and bilateral hip pain worse with walking, prolonged standing, weightbearing Lumbar Spine Area Exam  Skin & Axial Inspection: No masses, redness, or swelling Alignment: Symmetrical Functional ROM: Pain restricted ROM affecting both sides Stability: No instability detected Muscle Tone/Strength: Functionally intact. No obvious neuro-muscular anomalies detected. Sensory (Neurological): Dermatomal pain pattern and facet mediated (multifactorial) Palpation: Complains of area being tender to palpation       Provocative Tests: Hyperextension/rotation test: (+) bilaterally for facet joint pain. Lumbar quadrant test (Kemp's test): (+) on the right for foraminal stenosis Lateral bending test: (+) ipsilateral radicular pain, on the right. Positive for right-sided foraminal stenosis. Patrick's Maneuver: deferred today                   FABER* test: deferred today                   S-I anterior distraction/compression test: deferred today         S-I lateral compression test: deferred today         S-I Thigh-thrust test: deferred today         S-I Gaenslen's test: deferred today         *(Flexion, ABduction and External Rotation) Gait &  Posture Assessment  Ambulation: Unassisted Gait: Relatively normal for age and body habitus Posture: WNL  Lower Extremity Exam      Side: Right lower extremity   Side: Left lower extremity  Stability: No instability observed           Stability: No instability observed          Skin & Extremity Inspection: Skin color, temperature, and hair growth are WNL. No peripheral edema or cyanosis. No masses, redness, swelling, asymmetry, or associated skin lesions. No contractures.   Skin & Extremity Inspection: Skin color, temperature, and hair growth are WNL. No peripheral edema or cyanosis. No masses, redness, swelling, asymmetry, or associated skin lesions. No contractures.  Functional ROM: Unrestricted ROM                   Functional ROM: Unrestricted ROM                  Muscle Tone/Strength: Functionally intact. No obvious neuro-muscular anomalies detected.   Muscle Tone/Strength: Functionally intact. No obvious neuro-muscular anomalies detected.  Sensory (Neurological): Dermatomal pain pattern         Sensory (Neurological): Unimpaired        DTR: Patellar: deferred today Achilles: deferred today Plantar: deferred today   DTR: Patellar: deferred today Achilles: deferred today Plantar: deferred today  Palpation: No palpable anomalies   Palpation: No palpable anomalies    Assessment   Diagnosis Status  1. 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 Neuroforaminal stenosis of lumbar  spine (right L5)   3. Chronic pain syndrome   4. Lumbar facet arthropathy   5. Lumbar spondylosis   6. Chronic hip pain, bilateral   7. Chronic pain of right knee   8. Pain management contract signed   9. Pain management contract discussed    Controlled Controlled Controlled   Updated Problems: Problem  Bilateral Knee Pain  Chronic Hip Pain, Bilateral  Chronic Pain of Right Knee  Lumbar Spondylosis  Chronic Pain Syndrome  Pain Management Contract Signed  Pain Management Contract Discussed     Plan of Care  Problem-specific:  Assessment and Plan    Chronic low back pain with radiculopathy and lumbar spinal stenosis and spondylosis Pain radiates to hips, buttocks, and thighs. Previous transforaminal epidural injection. Current pain management is inadequate. Recent imaging is outdated. - Order lumbar MRI. - Discuss results with supervising physician for potential interventional therapy and medication management. - Initiate Norco 5-325 mg, three times daily for 30 days. - Obtain urine sample for pain management evaluation. - Establish pain contract for medication management. Prescribing drug monitoring (PMP) reviewed. Routine UDS ordered today.  Schedule follow-up in 1 week for review order test.   Chronic right knee pain Chronic pain persisting for approximately 20 years. Previous imaging suggested ACL tear. Current pain management is inadequate. - Order right knee x-ray. - Discuss results with supervising physician for potential interventional therapy and medication management.  Chronic bilateral hip pain Pain radiates from the back to the hips. Previous imaging is outdated. Current pain management is inadequate. - Order bilateral hip x-ray. - Discuss results with supervising physician for potential interventional therapy and medication management.  Chronic right thumb and finger pain Differential diagnosis includes rheumatoid arthritis or osteoarthritis. No recent evaluation or imaging. - The patient was advised to talk to primary care provider for evaluation and potential referral to rheumatologist.       Ronald Watts has a current medication list which includes the following long-term medication(s): eszopiclone, ferrous sulfate , gabapentin , gabapentin , omeprazole, sildenafil, and hydrochlorothiazide .  Pharmacotherapy (Medications Ordered): Meds ordered this encounter  Medications   HYDROcodone -acetaminophen  (NORCO/VICODIN) 5-325 MG tablet    Sig: Take 1 tablet  by mouth 3 (three) times daily as needed for severe pain (pain score 7-10). Must last 30 days    Dispense:  90 tablet    Refill:  0    Chronic Pain: STOP Act (Not applicable) Fill 1 day early if closed on refill date. Avoid benzodiazepines within 8 hours of opioids   Orders:  Orders Placed This Encounter  Procedures   MR LUMBAR SPINE WO CONTRAST    Patient presents with axial pain with possible radicular component. Please assist us  in identifying specific level(s) and laterality of any additional findings such as: 1. Facet (Zygapophyseal) joint DJD (Hypertrophy, space narrowing, subchondral sclerosis, and/or osteophyte formation) 2. DDD and/or IVDD (Loss of disc height, desiccation, gas patterns, osteophytes, endplate sclerosis, or Black disc disease) 3. Pars defects 4. Spondylolisthesis, spondylosis, and/or spondyloarthropathies (include Degree/Grade of displacement in mm) (stability) 5. Vertebral body Fractures (acute/chronic) (state percentage of collapse) 6. Demineralization (osteopenia/osteoporotic) 7. Bone pathology 8. Foraminal narrowing  9. Surgical changes 10. Central, Lateral Recess, and/or Foraminal Stenosis (include AP diameter of stenosis in mm) 11. Surgical changes (hardware type, status, and presence of fibrosis) 12. Modic Type Changes (MRI only) 13. IVDD (Disc bulge, protrusion, herniation, extrusion) (Level, laterality, extent)    Standing Status:   Future    Expiration Date:   07/24/2024  Scheduling Instructions:     Please make sure that the patient understands that this needs to be done as soon as possible. Never have the patient do the imaging just before the next appointment. Inform patient that having the imaging done within the [facility] will expedite the availability of the results and will provide      imaging availability to the requesting physician. In addition inform the patient that the imaging order has an expiration date and will not be renewed if  not done within the active period.    What is the patient's sedation requirement?:   No Sedation    Does the patient have a pacemaker or implanted devices?:   No    Preferred imaging location?:   [facility] (table limit-350lbs)    Call Results- Best Contact Number?:   [phone] [place] [facility]    Radiology Contrast Protocol - do NOT remove file path:   [email or web address]   DG HIPS BILAT W OR W/O PELVIS MIN 5 VIEWS    Standing Status:   Future    Expiration Date:   07/24/2024    Scheduling Instructions:     Please make sure that the patient understands that this needs to be done as soon as possible. Never have the patient do the imaging just before the next appointment. Inform patient that having the imaging done within the [facility] will expedite the availability of the results and will provide      imaging availability to the requesting physician. In addition inform the patient that the imaging order has an expiration date and will not be renewed if not done within the active period.    Reason for Exam (SYMPTOM  OR DIAGNOSIS REQUIRED):   Chronic bilateral hip pain (M25.551, M25.552, G89.29)    Preferred imaging location?:   [facility]    Release to patient:   Immediate    Call Results- Best Contact Number?:   [phone] [place] [facility]   DG Knee Complete 4 Views Right    Standing Status:   Future    Expiration Date:   07/24/2024    Scheduling Instructions:     Please make sure that the patient understands that this needs to be done as soon as possible. Never have the patient do the imaging just before the next appointment. Inform patient that having the imaging done within the [facility] will expedite the availability of the results and will provide      imaging availability to the requesting physician. In addition inform the patient that the  imaging order has an expiration date and will not be renewed if not done within the active period.    Reason for Exam (SYMPTOM  OR DIAGNOSIS REQUIRED):   Right knee pain/arthralgia    Preferred imaging location?:    [facility]    Release to patient:   Immediate    Call Results- Best Contact Number?:   [phone] [place] [facility] (MW), Urine    Volume: 30 ml(s). Minimum 3 ml of urine is needed. Document temperature of fresh sample. Indications: Long term (current) use of opiate analgesic (S20.108)    Release to patient:   Immediate        Return in about 1 week (around 04/30/2024) for (F2F), review of ordered tests, Emmy Blanch NP.    Recent Visits No visits were found  meeting these conditions. Showing recent visits within past 90 days and meeting all other requirements Today's Visits Date Type Provider Dept  04/23/24 Office Visit Terrell Ostrand K, NP [facility]  Showing today's visits and meeting all other requirements Future Appointments No visits were found meeting these conditions. Showing future appointments within next 90 days and meeting all other requirements  I discussed the assessment and treatment plan with the patient. The patient was provided an opportunity to ask questions and all were answered. The patient agreed with the plan and demonstrated an understanding of the instructions.  Patient advised to call back or seek an in-person evaluation if the symptoms or condition worsens.  I personally spent a total of 30 minutes in the care of the patient today including preparing to see the patient, getting/reviewing separately obtained history, performing a medically appropriate exam/evaluation, counseling and educating, placing orders, referring and communicating with other health care professionals, documenting clinical information in the EHR, independently interpreting results,  communicating results, and coordinating care.   Note by: Nnamdi Dacus K Sheretta Grumbine, NP (TTS and AI technology used. I apologize for any typographical errors that were not detected and corrected.) Date: 04/23/2024; Time: 3:54 PM

## 2024-04-24 ENCOUNTER — Ambulatory Visit
Admission: RE | Admit: 2024-04-24 | Discharge: 2024-04-24 | Disposition: A | Discharge status: Home / Self Care | Source: Ambulatory Visit | Attending: Nurse Practitioner | Admitting: Nurse Practitioner

## 2024-04-24 DIAGNOSIS — G8929 Other chronic pain: Secondary | ICD-10-CM | POA: Diagnosis present

## 2024-04-24 DIAGNOSIS — M5416 Radiculopathy, lumbar region: Secondary | ICD-10-CM | POA: Diagnosis present

## 2024-04-25 ENCOUNTER — Encounter: Payer: Self-pay | Admitting: Nurse Practitioner

## 2024-04-27 LAB — TOXASSURE SELECT 13 (MW), URINE

## 2024-04-29 ENCOUNTER — Ambulatory Visit
Admission: RE | Admit: 2024-04-29 | Discharge: 2024-04-29 | Disposition: A | Source: Ambulatory Visit | Attending: Nurse Practitioner | Admitting: Nurse Practitioner

## 2024-04-29 ENCOUNTER — Ambulatory Visit
Admission: RE | Admit: 2024-04-29 | Discharge: 2024-04-29 | Disposition: A | Discharge status: Home / Self Care | Source: Ambulatory Visit | Attending: Nurse Practitioner | Admitting: Nurse Practitioner

## 2024-04-29 DIAGNOSIS — G8929 Other chronic pain: Secondary | ICD-10-CM

## 2024-04-29 DIAGNOSIS — M25561 Pain in right knee: Secondary | ICD-10-CM | POA: Insufficient documentation

## 2024-04-29 DIAGNOSIS — M25551 Pain in right hip: Secondary | ICD-10-CM | POA: Insufficient documentation

## 2024-04-29 DIAGNOSIS — M25552 Pain in left hip: Secondary | ICD-10-CM | POA: Diagnosis present

## 2024-05-07 ENCOUNTER — Encounter: Payer: Self-pay | Admitting: Nurse Practitioner

## 2024-05-07 ENCOUNTER — Ambulatory Visit: Attending: Nurse Practitioner | Admitting: Nurse Practitioner

## 2024-05-07 VITALS — BP 137/96 | HR 59 | Temp 97.3°F | Resp 18 | Ht 70.0 in | Wt 142.0 lb

## 2024-05-07 DIAGNOSIS — M48061 Spinal stenosis, lumbar region without neurogenic claudication: Secondary | ICD-10-CM | POA: Diagnosis present

## 2024-05-07 DIAGNOSIS — G8929 Other chronic pain: Secondary | ICD-10-CM | POA: Insufficient documentation

## 2024-05-07 DIAGNOSIS — G894 Chronic pain syndrome: Secondary | ICD-10-CM | POA: Diagnosis not present

## 2024-05-07 DIAGNOSIS — M47816 Spondylosis without myelopathy or radiculopathy, lumbar region: Secondary | ICD-10-CM | POA: Insufficient documentation

## 2024-05-07 DIAGNOSIS — M5416 Radiculopathy, lumbar region: Secondary | ICD-10-CM | POA: Insufficient documentation

## 2024-05-07 DIAGNOSIS — M25551 Pain in right hip: Secondary | ICD-10-CM | POA: Insufficient documentation

## 2024-05-07 DIAGNOSIS — M25552 Pain in left hip: Secondary | ICD-10-CM | POA: Insufficient documentation

## 2024-05-07 NOTE — Progress Notes (Signed)
 PROVIDER NOTE: Interpretation of information contained herein should be left to medically-trained personnel. Specific patient instructions are provided elsewhere under Patient Instructions section of medical record. This document was created in part using AI and STT-dictation technology, any transcriptional errors that may result from this process are unintentional.  Patient: Ronald Watts  Service: E/M   PCP: Ronald Rock HERO, MD  DOB: 1965/12/14  DOS: 05/07/2024  Provider: Emmy MARLA Blanch, NP  MRN: [id number]  Delivery: Face-to-face  Specialty: Interventional Pain Management  Type: Established Patient  Setting: Ambulatory outpatient facility  Specialty designation: 09  Referring Prov.: Ronald Rock HERO, MD  Location: Outpatient office facility       History of present illness (HPI) [name], a 58 y.o. year old male, is here today because of his low back pain. [name]'s primary complain today is Back Pain (lower)  Pertinent problems: 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  Pain Assessment: Severity of Chronic pain is reported as a 10-Worst pain ever/10. Location: Back Lower/both hips and tights, right leg. Onset: More than a month ago. Quality: Aching, Burning, Stabbing. Timing: Constant. Modifying factor(s): Tylenol , BC, Ibuprofen . Vitals:  height is 5' 10 (1.778 m) and weight is 142 lb (64.4 kg). His temporal temperature is 97.3 F (36.3 C) (abnormal). His blood pressure is 137/96 (abnormal) and his pulse is 59 (abnormal). His respiration is 18 and oxygen saturation is 100%.  BMI: Estimated body mass index is 20.37 kg/m as calculated  from the following:   Height as of this encounter: 5' 10 (1.778 m).   Weight as of this encounter: 142 lb (64.4 kg).  Last encounter: 04/23/2024. Last procedure: Visit date not found.  Reason for encounter: follow-up evaluation for review of ordered test.  The patient presents for evaluation of imaging studies, including right knee x-ray, bilateral hip x-ray and lumbar MRI.  His right knee x-ray showed mild to moderate osteoarthritis with the presence of bone spurs and a small right knee effusion.  Bilateral hip x-ray demonstrated degenerative changes of lumbar spine consistent with lumbar spondylosis.  Lumbar MRI revealed foraminal stenosis at L3-L4 with marked degenerative endplate changes and disc space narrowing, more pronounced at L5-S1.  Discussed the use of AI scribe software for clinical note transcription with the patient, who gave verbal consent to proceed.  History of Present Illness   He is a 58 year old male with osteoarthritis and lumbar spondylosis who presents for a review of lab and imaging results.  Recent x-rays of the right knee show arthritis and a small amount of joint effusion. The hip x-ray reveals degenerative changes in the lower lumbar spine and pelvic vascular calcification.  An MRI of the lumbar spine indicates moderate right foraminal stenosis and degenerative changes, including disc height loss and end plate signal changes. He describes a 'pinching' sensation, with burning pain across his back, buttocks, and down the front of his thighs. He has difficulty sitting straight and needs to lean or lie sideways with his legs up to sleep.  He uses hydrocodone  5/325 mg, but reports that it is not strong enough for his pain. He has a history of THC use, detected in a recent urine test, and has  quit using THC as of last week.     Pharmacotherapy Assessment   Monitoring: [place] PMP: PDMP reviewed during this encounter.       Pharmacotherapy: No side-effects or adverse  reactions reported. Compliance: No problems identified. Effectiveness: Clinically acceptable.  Shela Reda CROME, RN  05/07/2024  2:43 PM  Sign when Signing Visit After patient was discharged, I called him, at [facility] request, to offer for him to return in about 30 days for a repeat UDS in order to potentially receive a prescription for opioid in the future.  He declined to make an appt at this time.    UDS:  Summary  Date Value Ref Range Status  04/24/2024 FINAL  Final    Comment:    ==================================================================== [employer] Select 13 (MW) ==================================================================== Test                             Result       Flag       Units  Drug Present not Declared for Prescription Verification   Carboxy-THC                    847          UNEXPECTED ng/mg creat    Carboxy-THC is a metabolite of tetrahydrocannabinol (THC). Source of    THC is most commonly herbal marijuana or marijuana-based products,    but THC is also present in a scheduled prescription medication.    Trace amounts of THC can be present in hemp and cannabidiol (CBD)    products. This test is not intended to distinguish between delta-9-    tetrahydrocannabinol, the predominant form of THC in most herbal or    marijuana-based products, and delta-8-tetrahydrocannabinol.  Drug Absent but Declared for Prescription Verification   Hydrocodone                     Not Detected UNEXPECTED ng/mg creat ==================================================================== Test                      Result    Flag   Units      Ref Range   Creatinine              47               mg/dL      >=79 ==================================================================== Declared Medications:  The flagging and interpretation on this report are based on the  following declared medications.  Unexpected results may arise from  inaccuracies in the declared  medications.   **Note: The testing scope of this panel includes these medications:   Hydrocodone    **Note: The testing scope of this panel does not include the  following reported medications:   Acetaminophen   Atorvastatin (Lipitor)  Cyclobenzaprine  (Flexeril )  Eszopiclone  Gabapentin   Ibuprofen  (Advil )  Iron  Lisinopril  (Zestril )  Magnesium  (Mag-Ox)  Omeprazole (Prilosec)  Pantoprazole  (Protonix )  Polyethylene Glycol (MiraLAX)  Sildenafil (Viagra) ==================================================================== For clinical consultation, please call [phone]. ====================================================================     No results found for: CBDTHCR No results found for: D8THCCBX No results found for: D9THCCBX  ROS  Constitutional: Denies any fever or chills Gastrointestinal: No reported hemesis, hematochezia, vomiting, or acute GI distress Musculoskeletal: Low back pain Neurological: No reported episodes of acute onset apraxia, aphasia, dysarthria, agnosia, amnesia, paralysis, loss of coordination, or loss of consciousness  Medication Review  Eszopiclone, HYDROcodone -acetaminophen , atorvastatin, cyclobenzaprine , ferrous  sulfate, gabapentin , ibuprofen , lisinopril , magnesium  oxide, omeprazole, pantoprazole , polyethylene glycol powder, and sildenafil  History Review  Allergy: [name] has no known allergies. Drug: [name]  reports current drug use. Drug: Marijuana. Alcohol:  reports current alcohol use of about 6.0 standard drinks of alcohol per week. Tobacco:  reports that he has been smoking cigarettes. He has a 30 pack-year smoking history. He has never used smokeless tobacco. Social: [name]  reports that he has been smoking cigarettes. He has a 30 pack-year smoking history. He has never used smokeless tobacco. He reports current alcohol use of about 6.0 standard drinks of alcohol per week. He reports current drug use. Drug:  Marijuana. Medical:  has a past medical history of Alcohol-induced acute pancreatitis, Anemia, Barrett's syndrome, COPD (chronic obstructive pulmonary disease) (HCC), ED (erectile dysfunction), GERD (gastroesophageal reflux disease), Hepatitis, History of blood transfusion, HLD (hyperlipidemia), Hypertension, Knee pain, right, Sciatica, and Stroke (HCC). Surgical: Mr. Sanor  has a past surgical history that includes Colonoscopy; Upper gi endoscopy; Cyst excision; Olecranon bursectomy (Right, 01/29/2018); Hemorrhoid banding; Colonoscopy with propofol  (N/A, 09/30/2021); Esophagogastroduodenoscopy (egd) with propofol  (N/A, 09/30/2021); and Hemorrhoid surgery (N/A, 02/05/2024). Family: family history includes Cancer in his father; Diabetes in his mother; Hypertension in his father and mother.  Laboratory Chemistry Profile   Renal Lab Results  Component Value Date   BUN 10 02/05/2024   CREATININE 1.10 02/05/2024   GFRNONAA >60 02/05/2024    Hepatic Lab Results  Component Value Date   AST 33 05/09/2021   ALT 38 05/09/2021   ALBUMIN 3.3 (L) 05/09/2021   ALKPHOS 83 05/09/2021   HCVAB NON REACTIVE 05/03/2021   LIPASE 65 (H) 05/09/2021   AMMONIA 18 05/03/2021    Electrolytes Lab Results  Component Value Date   NA 146 (H) 02/05/2024   K 4.3 02/05/2024   CL 109 02/05/2024   CALCIUM 9.3 02/05/2024   MG 1.9 05/09/2021   PHOS 4.3 05/09/2021    Bone No results found for: VD25OH, VD125OH2TOT, CI6874NY7, CI7874NY7, 25OHVITD1, 25OHVITD2, 25OHVITD3, TESTOFREE, TESTOSTERONE  Inflammation (CRP: Acute Phase) (ESR: Chronic Phase) No results found for: CRP, ESRSEDRATE, LATICACIDVEN       Note: Above Lab results reviewed.  Recent Imaging Review  DG Knee Complete 4 Views Right EXAM: 4 OR MORE VIEW(S) XRAY OF THE RIGHT KNEE 04/29/2024 12:20:00 PM  COMPARISON: 06/22/2012  CLINICAL HISTORY: Right knee pain/arthralgia. Chronic bilateral hip pain and right knee pain. No recent  injury. No previous surgery to hips or right knee.  FINDINGS:  BONES AND JOINTS: No acute fracture. No focal osseous lesion. No joint dislocation. Small right knee effusion. Mild to moderate bicompartmental degenerative arthritis with osteophyte formation involving medial and lateral compartments.  SOFT TISSUES: Vascular calcifications within posterior soft tissues.  IMPRESSION: 1. Mild to moderate bicompartmental osteoarthritis of the medial and lateral compartments with osteophyte formation. 2. Small right knee joint effusion. 3. Vascular calcifications in the posterior soft tissues.  Electronically signed by: Katheleen Faes MD 05/01/2024 09:35 AM EDT RP Workstation: [id number] DG HIPS BILAT W OR W/O PELVIS MIN 5 VIEWS EXAM: 5 OR MORE VIEW(S) XRAY OF THE BILATERAL HIP 04/29/2024 12:20:00 PM  COMPARISON: CT 12/14/2023.  CLINICAL HISTORY: Chronic bilateral hip pain (M25.551, M25.552, G89.29). Chronic bilateral hip pain and right knee pain. No recent injury. No previous surgery to hips or right knee.  FINDINGS:  BONES AND JOINTS: No acute fracture or focal osseous lesion. The hip joint is maintained. No significant degenerative changes.  LUMBAR SPINE: Lower lumbar  spondylosis.  SOFT TISSUES: Vascular calcifications within the pelvis.  IMPRESSION: 1. No acute osseous abnormality. 2. Degenerative changes of the lower lumbar spine. 3. Pelvic vascular calcifications.  Electronically signed by: Katheleen Faes MD 05/01/2024 09:34 AM EDT RP Workstation: [id number] Note: Reviewed        Physical Exam  Vitals: BP (!) 137/96 (Cuff Size: Normal)   Pulse (!) 59   Temp (!) 97.3 F (36.3 C) (Temporal)   Resp 18   Ht 5' 10 (1.778 m)   Wt 142 lb (64.4 kg)   SpO2 100%   BMI 20.37 kg/m  BMI: Estimated body mass index is 20.37 kg/m as calculated from the following:   Height as of this encounter: 5' 10 (1.778 m).   Weight as of this encounter: 142 lb (64.4 kg). Ideal:  Ideal body weight: 73 kg (160 lb 15 oz) General appearance: Well nourished, well developed, and well hydrated. In no apparent acute distress Mental status: Alert, oriented x 3 (person, place, & time)       Respiratory: No evidence of acute respiratory distress Eyes: PERLA  Musculoskeletal: +LBP Assessment   Diagnosis Status  1. 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 Neuroforaminal stenosis of lumbar spine (right L5)   3. Chronic pain syndrome   4. Lumbar facet arthropathy   5. Lumbar spondylosis   6. Chronic hip pain, bilateral    Controlled Controlled Controlled   Updated Problems: No problems updated.  Plan of Care  Problem-specific:  Assessment and Plan    Chronic radicular lumbar pain: The patient presents for evaluation of imaging studies, including right knee x-ray, bilateral hip x-ray and lumbar MRI.  His right knee x-ray showed mild to moderate osteoarthritis with the presence of bone spurs and a small right knee effusion.  Bilateral hip x-ray demonstrated degenerative changes of lumbar spine consistent with lumbar spondylosis.  Lumbar MRI revealed foraminal stenosis at L3-L4 with marked degenerative endplate changes and disc space narrowing, more pronounced at L5-S1.   Lumbar MRI: Progressed broad-based disc bulge at L4-5 with moderately severe to severe foraminal stenosis, greater on the right, with encroachment on the descending L5 nerve root. Marked bilateral foraminal narrowing at L5-S1, unchanged.  Chronic hip pain (bilateral):  Bilateral hip x-ray demonstrated degenerative changes of lumbar spine consistent with lumbar spondylosis.   Interventional treatment options were discussed; however the patient declined any interventional procedures and prefers to continue with medication management.  I clearly discussed and explained the presence of THC in his urine.  He needs to provide clear urine next 30 to 40 days in order to continue with medication management.       Ronald Watts has a current medication list which includes the following long-term medication(s): eszopiclone, ferrous sulfate , gabapentin , gabapentin , omeprazole, and sildenafil.  Pharmacotherapy (Medications Ordered): No orders of the defined types were placed in this encounter.  Orders:  No orders of the defined types were placed in this encounter.       Return in about 1 month (around 06/07/2024) for (F2F), (MM), Emmy Blanch NP.    Recent Visits Date Type Provider Dept  04/23/24 Office Visit Mende Biswell K, NP [facility]  Showing recent visits within past 90 days and meeting all other requirements Today's Visits Date Type Provider Dept  05/07/24 Office Visit Juandedios Dudash K, NP [facility]  Showing today's visits and meeting all other requirements Future Appointments No visits were found meeting these conditions. Showing future appointments within  next 90 days and meeting all other requirements  I discussed the assessment and treatment plan with the patient. The patient was provided an opportunity to ask questions and all were answered. The patient agreed with the plan and demonstrated an understanding of the instructions.  Patient advised to call back or seek an in-person evaluation if the symptoms or condition worsens.  I personally spent a total of 20 minutes in the care of the patient today including preparing to see the patient, getting/reviewing separately obtained history, performing a medically appropriate exam/evaluation, counseling and educating, placing orders, referring and communicating with other health care professionals, documenting clinical information in the EHR, independently interpreting results, communicating results, and coordinating care.   Note by: Laporcha Marchesi K Tomio Kirk, NP (TTS and AI technology used. I apologize for any typographical errors that were not detected and corrected.) Date: 05/07/2024; Time: 2:52 PM

## 2024-05-07 NOTE — Progress Notes (Signed)
 After patient was discharged, I called him, at [facility] request, to offer for him to return in about 30 days for a repeat UDS in order to potentially receive a prescription for opioid in the future.  He declined to make an appt at this time.
